# Patient Record
Sex: Female | Born: 1955 | Race: White | Hispanic: No | Marital: Married | Smoking: Never smoker
Health system: Southern US, Community
[De-identification: ages and names within clinical notes are randomized; demographics above are authoritative.]

## PROBLEM LIST (undated history)

## (undated) DIAGNOSIS — K219 Gastro-esophageal reflux disease without esophagitis: Secondary | ICD-10-CM

## (undated) DIAGNOSIS — Z923 Personal history of irradiation: Secondary | ICD-10-CM

## (undated) DIAGNOSIS — C801 Malignant (primary) neoplasm, unspecified: Secondary | ICD-10-CM

## (undated) DIAGNOSIS — K449 Diaphragmatic hernia without obstruction or gangrene: Secondary | ICD-10-CM

## (undated) DIAGNOSIS — F329 Major depressive disorder, single episode, unspecified: Secondary | ICD-10-CM

## (undated) DIAGNOSIS — F32A Depression, unspecified: Secondary | ICD-10-CM

## (undated) DIAGNOSIS — F419 Anxiety disorder, unspecified: Secondary | ICD-10-CM

## (undated) DIAGNOSIS — G4730 Sleep apnea, unspecified: Secondary | ICD-10-CM

## (undated) HISTORY — DX: Diaphragmatic hernia without obstruction or gangrene: K44.9

## (undated) HISTORY — DX: Depression, unspecified: F32.A

## (undated) HISTORY — DX: Sleep apnea, unspecified: G47.30

## (undated) HISTORY — DX: Major depressive disorder, single episode, unspecified: F32.9

## (undated) HISTORY — PX: ABDOMINAL HYSTERECTOMY: SHX81

## (undated) HISTORY — DX: Malignant (primary) neoplasm, unspecified: C80.1

## (undated) HISTORY — DX: Gastro-esophageal reflux disease without esophagitis: K21.9

## (undated) HISTORY — PX: COLONOSCOPY: SHX174

## (undated) HISTORY — DX: Anxiety disorder, unspecified: F41.9

---

## 1999-06-24 ENCOUNTER — Other Ambulatory Visit: Admission: RE | Admit: 1999-06-24 | Discharge: 1999-06-24 | Payer: Self-pay | Admitting: Obstetrics and Gynecology

## 2000-06-18 ENCOUNTER — Other Ambulatory Visit: Admission: RE | Admit: 2000-06-18 | Discharge: 2000-06-18 | Payer: Self-pay | Admitting: *Deleted

## 2000-06-18 ENCOUNTER — Other Ambulatory Visit: Admission: RE | Admit: 2000-06-18 | Discharge: 2000-06-18 | Payer: Self-pay | Admitting: Obstetrics and Gynecology

## 2000-06-25 ENCOUNTER — Encounter: Admission: RE | Admit: 2000-06-25 | Discharge: 2000-06-25 | Payer: Self-pay | Admitting: *Deleted

## 2000-06-25 ENCOUNTER — Other Ambulatory Visit: Admission: RE | Admit: 2000-06-25 | Discharge: 2000-06-25 | Payer: Self-pay | Admitting: *Deleted

## 2001-01-17 ENCOUNTER — Other Ambulatory Visit: Admission: RE | Admit: 2001-01-17 | Discharge: 2001-01-17 | Payer: Self-pay | Admitting: *Deleted

## 2001-01-31 ENCOUNTER — Encounter: Admission: RE | Admit: 2001-01-31 | Discharge: 2001-01-31 | Payer: Self-pay | Admitting: *Deleted

## 2001-04-07 ENCOUNTER — Other Ambulatory Visit: Admission: RE | Admit: 2001-04-07 | Discharge: 2001-04-07 | Payer: Self-pay | Admitting: *Deleted

## 2001-11-18 ENCOUNTER — Encounter: Payer: Self-pay | Admitting: *Deleted

## 2001-11-18 ENCOUNTER — Encounter: Admission: RE | Admit: 2001-11-18 | Discharge: 2001-11-18 | Payer: Self-pay | Admitting: *Deleted

## 2001-11-21 ENCOUNTER — Other Ambulatory Visit: Admission: RE | Admit: 2001-11-21 | Discharge: 2001-11-21 | Payer: Self-pay | Admitting: *Deleted

## 2002-02-17 ENCOUNTER — Other Ambulatory Visit: Admission: RE | Admit: 2002-02-17 | Discharge: 2002-02-17 | Payer: Self-pay | Admitting: *Deleted

## 2002-04-30 ENCOUNTER — Emergency Department (HOSPITAL_COMMUNITY): Admission: EM | Admit: 2002-04-30 | Discharge: 2002-04-30 | Payer: Self-pay | Admitting: Emergency Medicine

## 2002-05-24 ENCOUNTER — Encounter: Admission: RE | Admit: 2002-05-24 | Discharge: 2002-05-24 | Payer: Self-pay | Admitting: *Deleted

## 2003-07-19 ENCOUNTER — Ambulatory Visit (HOSPITAL_BASED_OUTPATIENT_CLINIC_OR_DEPARTMENT_OTHER): Admission: RE | Admit: 2003-07-19 | Discharge: 2003-07-19 | Payer: Self-pay | Admitting: Otolaryngology

## 2003-08-16 ENCOUNTER — Other Ambulatory Visit: Admission: RE | Admit: 2003-08-16 | Discharge: 2003-08-16 | Payer: Self-pay | Admitting: *Deleted

## 2003-12-03 ENCOUNTER — Encounter: Admission: RE | Admit: 2003-12-03 | Discharge: 2003-12-03 | Payer: Self-pay | Admitting: *Deleted

## 2005-10-21 ENCOUNTER — Encounter: Admission: RE | Admit: 2005-10-21 | Discharge: 2005-10-21 | Payer: Self-pay | Admitting: *Deleted

## 2006-03-18 ENCOUNTER — Encounter: Admission: RE | Admit: 2006-03-18 | Discharge: 2006-03-18 | Payer: Self-pay | Admitting: Gastroenterology

## 2006-04-22 ENCOUNTER — Ambulatory Visit (HOSPITAL_COMMUNITY): Admission: RE | Admit: 2006-04-22 | Discharge: 2006-04-22 | Payer: Self-pay | Admitting: Gastroenterology

## 2006-09-15 ENCOUNTER — Ambulatory Visit (HOSPITAL_COMMUNITY): Admission: RE | Admit: 2006-09-15 | Discharge: 2006-09-15 | Payer: Self-pay | Admitting: Obstetrics and Gynecology

## 2006-11-26 ENCOUNTER — Encounter: Admission: RE | Admit: 2006-11-26 | Discharge: 2006-11-26 | Payer: Self-pay | Admitting: Gastroenterology

## 2012-09-30 ENCOUNTER — Other Ambulatory Visit: Payer: Self-pay | Admitting: Internal Medicine

## 2012-09-30 DIAGNOSIS — R1310 Dysphagia, unspecified: Secondary | ICD-10-CM

## 2012-10-14 ENCOUNTER — Inpatient Hospital Stay: Admission: RE | Admit: 2012-10-14 | Payer: Self-pay | Source: Ambulatory Visit

## 2012-10-28 ENCOUNTER — Ambulatory Visit
Admission: RE | Admit: 2012-10-28 | Discharge: 2012-10-28 | Disposition: A | Discharge status: Home / Self Care | Payer: BC Managed Care – PPO | Source: Ambulatory Visit | Attending: Internal Medicine | Admitting: Internal Medicine

## 2012-10-28 DIAGNOSIS — R1310 Dysphagia, unspecified: Secondary | ICD-10-CM

## 2013-08-19 ENCOUNTER — Ambulatory Visit (INDEPENDENT_AMBULATORY_CARE_PROVIDER_SITE_OTHER): Payer: BC Managed Care – PPO | Admitting: Emergency Medicine

## 2013-08-19 VITALS — BP 120/82 | HR 90 | Temp 98.2°F | Resp 18 | Ht 66.0 in | Wt 183.4 lb

## 2013-08-19 DIAGNOSIS — J209 Acute bronchitis, unspecified: Secondary | ICD-10-CM

## 2013-08-19 MED ORDER — AZITHROMYCIN 250 MG PO TABS: ORAL_TABLET | ORAL | Status: DC

## 2013-08-19 MED ORDER — HYDROCOD POLST-CHLORPHEN POLST 10-8 MG/5ML PO LQCR: 5.0000 mL | Freq: Two times a day (BID) | ORAL | Status: DC | PRN

## 2013-08-19 NOTE — Progress Notes (Signed)
[facility] [address], [place] [address] [phone]  Date:  08/19/2013   Name:  Lisa Price   DOB:  1956-01-19   MRN:  [id number]  PCP:  Elby Showers, MD    Chief Complaint: Pneumonia, Cough and Sore Throat   History of Present Illness:  Lisa Price is a 57 y.o. very pleasant female patient who presents with the following:  Ill with nasal congestion and cough. Says that she has pain in her back from coughing.  Says cough productive but she does not spit out the sputum.  Has myalgias and arthralgias and headache.  Feels hot.  No documented fever or chills. No wheezing or shortness of breath.  History of prior pneumonia.  Husband brought illness home to her.  No improvement with over the counter medications or other home remedies. Denies other complaint or health concern today.   There are no active problems to display for this patient.   Past Medical History  Diagnosis Date  . Anxiety   . Depression     Past Surgical History  Procedure Laterality Date  . Abdominal hysterectomy      History  Substance Use Topics  . Smoking status: Never Smoker   . Smokeless tobacco: Not on file  . Alcohol Use: No    History reviewed. No pertinent family history.  No Known Allergies  Medication list has been reviewed and updated.  No current outpatient prescriptions on file prior to visit.   No current facility-administered medications on file prior to visit.    Review of Systems:  As per HPI, otherwise negative.    Physical Examination: Filed Vitals:   08/19/13 1113  BP: 120/82  Pulse: 90  Temp: 98.2 F (36.8 C)  Resp: 18   Filed Vitals:   08/19/13 1113  Height: 5\' 6"  (1.676 m)  Weight: 183 lb 6.4 oz (83.19 kg)   Body mass index is 29.62 kg/(m^2). Ideal Body Weight: Weight in (lb) to have BMI = 25: 154.6  GEN: WDWN, NAD, Non-toxic, A & O x 3 HEENT: Atraumatic, Normocephalic. Neck supple. No masses, No LAD. Ears and Nose: No  external deformity. CV: RRR, No M/G/R. No JVD. No thrill. No extra heart sounds. PULM: CTA B, no wheezes, crackles, rhonchi. No retractions. No resp. distress. No accessory muscle use. ABD: S, NT, ND, +BS. No rebound. No HSM. EXTR: No c/c/e NEURO Normal gait.  PSYCH: Normally interactive. Conversant. Not depressed or anxious appearing.  Calm demeanor.    Assessment and Plan: Bronchitis zpak tussionex   Signed,  Phillips Odor, MD

## 2013-08-19 NOTE — Patient Instructions (Addendum)

## 2014-05-04 ENCOUNTER — Other Ambulatory Visit: Payer: Self-pay

## 2014-05-04 DIAGNOSIS — Z1231 Encounter for screening mammogram for malignant neoplasm of breast: Secondary | ICD-10-CM

## 2014-05-18 ENCOUNTER — Ambulatory Visit: Payer: BC Managed Care – PPO

## 2014-05-25 ENCOUNTER — Ambulatory Visit
Admission: RE | Admit: 2014-05-25 | Discharge: 2014-05-25 | Disposition: A | Discharge status: Home / Self Care | Payer: BC Managed Care – PPO | Source: Ambulatory Visit

## 2014-05-25 DIAGNOSIS — Z1231 Encounter for screening mammogram for malignant neoplasm of breast: Secondary | ICD-10-CM

## 2015-09-26 ENCOUNTER — Ambulatory Visit (HOSPITAL_BASED_OUTPATIENT_CLINIC_OR_DEPARTMENT_OTHER): Payer: BC Managed Care – PPO | Attending: Internal Medicine | Admitting: *Deleted

## 2015-09-26 VITALS — Ht 66.0 in | Wt 198.0 lb

## 2015-09-26 DIAGNOSIS — R0683 Snoring: Secondary | ICD-10-CM | POA: Insufficient documentation

## 2015-09-26 DIAGNOSIS — G4733 Obstructive sleep apnea (adult) (pediatric): Secondary | ICD-10-CM | POA: Diagnosis not present

## 2015-09-29 DIAGNOSIS — G4733 Obstructive sleep apnea (adult) (pediatric): Secondary | ICD-10-CM | POA: Diagnosis not present

## 2015-09-29 NOTE — Progress Notes (Signed)
Patient Name: Lisa Price, Lisa Price Date: 09/26/2015 Gender: Female D.O.B: 1956/09/29 Age (years): 59 Referring Provider: Georgie Chard Day Height (inches): 66 Interpreting Physician: Baird Lyons MD, ABSM Weight (lbs): 198 RPSGT: Ne[place]iemer, Holly BMI: 32 MRN: [id number] Neck Size: 15.00 CLINICAL INFORMATION Sleep Study Type: Split Night CPAP Indication for sleep study: Snoring Epworth Sleepiness Score: 12  SLEEP STUDY TECHNIQUE As per the AASM Manual for the Scoring of Sleep and Associated Events v2.3 (April 2016) with a hypopnea requiring 4% desaturations. The channels recorded and monitored were frontal, central and occipital EEG, electrooculogram (EOG), submentalis EMG (chin), nasal and oral airflow, thoracic and abdominal wall motion, anterior tibialis EMG, snore microphone, electrocardiogram, and pulse oximetry. Continuous positive airway pressure (CPAP) was initiated when the patient met split night criteria and was titrated according to treat sleep-disordered breathing.  MEDICATIONS Medications taken by the patient :charted for review Medications administered by patient during sleep study : Trazodone  RESPIRATORY PARAMETERS Diagnostic Total AHI (/hr): 19.2 RDI (/hr): 22.7 OA Index (/hr): 13.6 CA Index (/hr): 0.0 REM AHI (/hr): N/A NREM AHI (/hr): 19.2 Supine AHI (/hr): 31.6 Non-supine AHI (/hr): 0.00 Min O2 Sat (%): 85.00 Mean O2 (%): 92.92 Time below 88% (min): 1.4     Titration Optimal Pressure (cm): 14 AHI at Optimal Pressure (/hr): 0 Min O2 at Optimal Pressure (%): 94 Supine % at Optimal (%): N/A Sleep % at Optimal (%): N/A      SLEEP ARCHITECTURE The recording time for the entire night was 415.8 minutes. During a baseline period of 240.3 minutes, the patient slept for 137.2 minutes in REM and nonREM, yielding a sleep efficiency of 57.1%. Sleep onset after lights out was 79.6 minutes with a REM latency of N/A minutes. The patient spent 5.83% of the night in stage N1 sleep,  94.17% in stage N2 sleep, 0.00% in stage N3 and 0.00% in REM. During the titration period of 173.5 minutes, the patient slept for 143.5 minutes in REM and nonREM, yielding a sleep efficiency of 82.7%. Sleep onset after CPAP initiation was 14.4 minutes with a REM latency of N/A minutes. The patient spent 8.36% of the night in stage N1 sleep, 91.64% in stage N2 sleep, 0.00% in stage N3 and 0.00% in REM.  CARDIAC DATA The 2 lead EKG demonstrated sinus rhythm. The mean heart rate was 65.71 beats per minute. Other EKG findings include: None.  LEG MOVEMENT DATA The total Periodic Limb Movements of Sleep (PLMS) were 217. The PLMS index was 46.25 . Movement with arousals 1,3/ hr  IMPRESSIONS  -Moderate obstructive sleep apnea occurred during the diagnostic portion of the study(AHI = 19.2/hour).    - Optimal  CPAP pressure 14 - No significant central sleep apnea occurred during the diagnostic portion of the study (CAI = 0.0/hour). - The patient had minimal or no oxygen desaturation during the diagnostic portion of the study (Min O2 = 85.00%) - The patient snored with Loud snoring volume during the diagnostic portion of the study. - No cardiac abnormalities were noted during this study. - Clinically significant periodic limb movements did not occur during sleep.  DIAGNOSIS - Obstructive Sleep Apnea (327.23 [G47.33 ICD-10])  RECOMMENDATIONS - Recommend CPAP trial at 14 cwp. The patient used a medium Resmed AirFit F10 full face mask with heated humidifier. - Avoid alcohol, sedatives and other CNS depressants - CPAP titration.nts that may worsen sleep apnea and disrupt normal sleep architecture. - Sleep hygiene should be reviewed to assess factors that may improve sleep quality. - Weight management  and regular exercise should be initiated or continued.  Deneise Lever Diplomate, American Board of Sleep Medicine  ELECTRONICALLY SIGNED ON:  09/29/2015, 2:11 PM [place] PH: [phone]   FX: [phone] [place]

## 2017-05-21 ENCOUNTER — Other Ambulatory Visit: Payer: Self-pay | Admitting: Internal Medicine

## 2017-05-21 DIAGNOSIS — Z1231 Encounter for screening mammogram for malignant neoplasm of breast: Secondary | ICD-10-CM

## 2017-05-31 ENCOUNTER — Ambulatory Visit
Admission: RE | Admit: 2017-05-31 | Discharge: 2017-05-31 | Disposition: A | Discharge status: Home / Self Care | Payer: BC Managed Care – PPO | Source: Ambulatory Visit | Attending: Internal Medicine | Admitting: Internal Medicine

## 2017-05-31 DIAGNOSIS — Z1231 Encounter for screening mammogram for malignant neoplasm of breast: Secondary | ICD-10-CM

## 2017-06-01 ENCOUNTER — Other Ambulatory Visit: Payer: Self-pay | Admitting: Internal Medicine

## 2017-06-01 DIAGNOSIS — R928 Other abnormal and inconclusive findings on diagnostic imaging of breast: Secondary | ICD-10-CM

## 2017-06-03 ENCOUNTER — Ambulatory Visit
Admission: RE | Admit: 2017-06-03 | Discharge: 2017-06-03 | Disposition: A | Discharge status: Home / Self Care | Payer: BC Managed Care – PPO | Source: Ambulatory Visit | Attending: Internal Medicine | Admitting: Internal Medicine

## 2017-06-03 DIAGNOSIS — R928 Other abnormal and inconclusive findings on diagnostic imaging of breast: Secondary | ICD-10-CM

## 2019-08-25 ENCOUNTER — Other Ambulatory Visit: Payer: Self-pay | Admitting: Family Medicine

## 2019-08-25 DIAGNOSIS — Z1231 Encounter for screening mammogram for malignant neoplasm of breast: Secondary | ICD-10-CM

## 2019-10-23 ENCOUNTER — Other Ambulatory Visit: Payer: Self-pay

## 2019-10-23 ENCOUNTER — Ambulatory Visit
Admission: RE | Admit: 2019-10-23 | Discharge: 2019-10-23 | Disposition: A | Discharge status: Home / Self Care | Payer: BC Managed Care – PPO | Source: Ambulatory Visit | Attending: Family Medicine | Admitting: Family Medicine

## 2019-10-23 DIAGNOSIS — Z1231 Encounter for screening mammogram for malignant neoplasm of breast: Secondary | ICD-10-CM

## 2019-12-06 ENCOUNTER — Ambulatory Visit: Payer: BC Managed Care – PPO

## 2020-10-11 ENCOUNTER — Other Ambulatory Visit: Payer: Self-pay | Admitting: Obstetrics & Gynecology

## 2020-10-11 DIAGNOSIS — Z1231 Encounter for screening mammogram for malignant neoplasm of breast: Secondary | ICD-10-CM

## 2020-11-20 ENCOUNTER — Other Ambulatory Visit: Payer: Self-pay

## 2020-11-20 ENCOUNTER — Ambulatory Visit
Admission: RE | Admit: 2020-11-20 | Discharge: 2020-11-20 | Disposition: A | Discharge status: Home / Self Care | Payer: BC Managed Care – PPO | Source: Ambulatory Visit | Attending: Obstetrics & Gynecology | Admitting: Obstetrics & Gynecology

## 2020-11-20 ENCOUNTER — Ambulatory Visit: Payer: BC Managed Care – PPO

## 2020-11-20 DIAGNOSIS — Z1231 Encounter for screening mammogram for malignant neoplasm of breast: Secondary | ICD-10-CM

## 2020-11-26 ENCOUNTER — Other Ambulatory Visit: Payer: Self-pay | Admitting: Obstetrics & Gynecology

## 2020-11-26 DIAGNOSIS — R928 Other abnormal and inconclusive findings on diagnostic imaging of breast: Secondary | ICD-10-CM

## 2020-12-11 ENCOUNTER — Ambulatory Visit
Admission: RE | Admit: 2020-12-11 | Discharge: 2020-12-11 | Disposition: A | Discharge status: Home / Self Care | Payer: BC Managed Care – PPO | Source: Ambulatory Visit | Attending: Obstetrics & Gynecology | Admitting: Obstetrics & Gynecology

## 2020-12-11 ENCOUNTER — Other Ambulatory Visit: Payer: Self-pay | Admitting: Obstetrics & Gynecology

## 2020-12-11 ENCOUNTER — Other Ambulatory Visit: Payer: Self-pay

## 2020-12-11 DIAGNOSIS — R928 Other abnormal and inconclusive findings on diagnostic imaging of breast: Secondary | ICD-10-CM

## 2020-12-19 ENCOUNTER — Other Ambulatory Visit: Payer: Self-pay

## 2020-12-19 ENCOUNTER — Ambulatory Visit
Admission: RE | Admit: 2020-12-19 | Discharge: 2020-12-19 | Disposition: A | Discharge status: Home / Self Care | Payer: BC Managed Care – PPO | Source: Ambulatory Visit | Attending: Obstetrics & Gynecology | Admitting: Obstetrics & Gynecology

## 2020-12-19 DIAGNOSIS — R928 Other abnormal and inconclusive findings on diagnostic imaging of breast: Secondary | ICD-10-CM

## 2020-12-19 HISTORY — PX: BREAST BIOPSY: SHX20

## 2020-12-24 ENCOUNTER — Encounter: Payer: Self-pay | Admitting: *Deleted

## 2020-12-24 ENCOUNTER — Other Ambulatory Visit: Payer: Self-pay | Admitting: *Deleted

## 2020-12-24 DIAGNOSIS — C50412 Malignant neoplasm of upper-outer quadrant of left female breast: Secondary | ICD-10-CM

## 2020-12-24 DIAGNOSIS — Z170 Estrogen receptor positive status [ER+]: Secondary | ICD-10-CM | POA: Insufficient documentation

## 2020-12-24 NOTE — Progress Notes (Signed)
[facility] NOTE  Patient Care Team: Lauro Franklin, MD as PCP - General (Internal Medicine) Coralie Keens, MD as Consulting Physician (General Surgery) Nicholas Lose, MD as Consulting Physician (Hematology and Oncology) Eppie Gibson, MD as Attending Physician (Radiation Oncology) Mauro Kaufmann, RN as Oncology Nurse Navigator Rockwell Germany, RN as Oncology Nurse Navigator  CHIEF COMPLAINTS/PURPOSE OF CONSULTATION:  Newly diagnosed breast cancer  HISTORY OF PRESENTING ILLNESS:  Lisa Price 65 y.o. female is here because of recent diagnosis of invasive ductal carcinoma of the left breast. Screening mammogram on 11/25/20 showed a left breast distortion. Diagnostic mammogram on 12/11/20 showed a 0.8cm mass at the 2 o'clock position in the left breast and no left axillary adenopathy. Biopsy on 12/29/20 showed invasive and in situ carcinoma, grade 2, HER-2 equivocal by IHC (2+), ER/PR+ >95%, Ki67 3%. She presents to the clinic today for initial evaluation and discussion of treatment options.   I reviewed her records extensively and collaborated the history with the patient.  SUMMARY OF ONCOLOGIC HISTORY: Oncology History  Malignant neoplasm of upper-outer quadrant of left breast in female, estrogen receptor positive ([place])  12/24/2020 Initial Diagnosis   Screening mammogram showed a 0.8cm mass at the 2 o'clock position in the left breast and no left axillary adenopathy. Biopsy showed invasive and in situ carcinoma, grade 2, HER-2 equivocal by IHC (2+), ER/PR+ >95%, Ki67 3%.   12/25/2020 Cancer Staging   Staging form: Breast, AJCC 8th Edition - Clinical stage from 12/25/2020: Stage IA (cT1b, cN0, cM0, G2, ER+, PR+, HER2: Equivocal) - Signed by Nicholas Lose, MD on 12/25/2020     MEDICAL HISTORY:  Past Medical History:  Diagnosis Date  . Anxiety   . Depression   . Hiatal hernia   . Sleep apnea     SURGICAL HISTORY: Past Surgical History:  Procedure  Laterality Date  . ABDOMINAL HYSTERECTOMY      SOCIAL HISTORY: Social History   Socioeconomic History  . Marital status: Married    Spouse name: Not on file  . Number of children: Not on file  . Years of education: Not on file  . Highest education level: Not on file  Occupational History  . Not on file  Tobacco Use  . Smoking status: Never Smoker  . Smokeless tobacco: Not on file  Substance and Sexual Activity  . Alcohol use: No  . Drug use: No  . Sexual activity: Not on file  Other Topics Concern  . Not on file  Social History Narrative  . Not on file   Social Determinants of Health   Financial Resource Strain: Not on file  Food Insecurity: No Food Insecurity  . Worried About Charity fundraiser in the Last Year: Never true  . Ran Out of Food in the Last Year: Never true  Transportation Needs: No Transportation Needs  . Lack of Transportation (Medical): No  . Lack of Transportation (Non-Medical): No  Physical Activity: Not on file  Stress: Not on file  Social Connections: Not on file  Intimate Partner Violence: Not on file    FAMILY HISTORY: Family History  Problem Relation Age of Onset  . Breast cancer Paternal Grandmother   . Thyroid cancer Maternal Grandmother     ALLERGIES:  has No Known Allergies.  MEDICATIONS:  Current Outpatient Medications  Medication Sig Dispense Refill  . clonazePAM (KLONOPIN) 1 MG tablet 1 tablet    . desvenlafaxine (PRISTIQ) 100 MG 24 hr tablet Take 100 mg  by mouth at bedtime.    . fish oil-omega-3 fatty acids 1000 MG capsule Take 2 g by mouth daily.    Marland Kitchen L-Methylfolate-Algae (DEPLIN 15) 15-90.314 MG CAPS Take 1 tablet by mouth.    . lisdexamfetamine (VYVANSE) 70 MG capsule 1 capsule every morning    . traZODone (DESYREL) 100 MG tablet trazodone 100 mg tablet     No current facility-administered medications for this visit.    REVIEW OF SYSTEMS:   Constitutional: Denies fevers, chills or abnormal night sweats   All other  systems were reviewed with the patient and are negative.  PHYSICAL EXAMINATION: ECOG PERFORMANCE STATUS: 0 - Asymptomatic  Vitals:   12/25/20 0919  BP: 110/68  Pulse: 85  Resp: 16  Temp: (!) 97.5 F (36.4 C)  SpO2: 98%   Filed Weights   12/25/20 0919  Weight: 205 lb 11.2 oz (93.3 kg)    LABORATORY DATA:  I have reviewed the data as listed Lab Results  Component Value Date   WBC 9.1 12/25/2020   HGB 13.7 12/25/2020   HCT 41.7 12/25/2020   MCV 83.9 12/25/2020   PLT 352 12/25/2020   Lab Results  Component Value Date   NA 140 12/25/2020   K 4.0 12/25/2020   CL 107 12/25/2020   CO2 24 12/25/2020    RADIOGRAPHIC STUDIES: I have personally reviewed the radiological reports and agreed with the findings in the report.  ASSESSMENT AND PLAN:  Malignant neoplasm of upper-outer quadrant of left breast in female, estrogen receptor positive ([place]) 12/24/2020: Screening mammogram showed a 0.8cm mass at the 2 o'clock position in the left breast and no left axillary adenopathy. Biopsy showed invasive and in situ carcinoma, grade 2, HER-2 equivocal by IHC (2+), FISH pending ER/PR+ >95%, Ki67 3%. T1b N0 stage Ia clinical stage  Pathology and radiology counseling:Discussed with the patient, the details of pathology including the type of breast cancer,the clinical staging, the significance of ER, PR and HER-2/neu receptors and the implications for treatment. After reviewing the pathology in detail, we proceeded to discuss the different treatment options between surgery, radiation, chemotherapy, antiestrogen therapies.  Recommendations: 1. Breast conserving surgery with sentinel lymph node biopsy followed by 2. Oncotype DX testing to determine if chemotherapy would be of any benefit followed by 3. Adjuvant radiation therapy followed by 4. Adjuvant antiestrogen therapy with anastrozole 1 mg daily x5 to 7 years  Oncotype counseling: I discussed Oncotype DX test. I explained to the patient  that this is a 21 gene panel to evaluate patient tumors DNA to calculate recurrence score. This would help determine whether patient has high risk or low risk breast cancer. She understands that if her tumor was found to be high risk, she would benefit from systemic chemotherapy. If low risk, no need of chemotherapy.  Return to clinic after surgery to discuss final pathology report and then determine if Oncotype DX testing will need to be sent.    All questions were answered. The patient knows to call the clinic with any problems, questions or concerns.   Rulon Eisenmenger, MD, MPH 12/25/2020    I, Molly Dorshimer, am acting as scribe for Nicholas Lose, MD.  I have reviewed the above documentation for accuracy and completeness, and I agree with the above.

## 2020-12-24 NOTE — Progress Notes (Signed)
Confirmed [facility] for 12/25/20 at 0815 .  Instructions and contact information given.

## 2020-12-25 ENCOUNTER — Encounter: Payer: Self-pay | Admitting: Licensed Clinical Social Worker

## 2020-12-25 ENCOUNTER — Ambulatory Visit: Payer: BC Managed Care – PPO | Attending: Surgery | Admitting: Physical Therapy

## 2020-12-25 ENCOUNTER — Encounter: Payer: Self-pay | Admitting: Physical Therapy

## 2020-12-25 ENCOUNTER — Inpatient Hospital Stay: Payer: BC Managed Care – PPO | Attending: Hematology and Oncology | Admitting: Hematology and Oncology

## 2020-12-25 ENCOUNTER — Encounter: Payer: Self-pay | Admitting: *Deleted

## 2020-12-25 ENCOUNTER — Encounter: Payer: Self-pay | Admitting: Genetic Counselor

## 2020-12-25 ENCOUNTER — Ambulatory Visit (HOSPITAL_BASED_OUTPATIENT_CLINIC_OR_DEPARTMENT_OTHER): Payer: BC Managed Care – PPO | Admitting: Genetic Counselor

## 2020-12-25 ENCOUNTER — Other Ambulatory Visit: Payer: Self-pay

## 2020-12-25 ENCOUNTER — Inpatient Hospital Stay: Payer: BC Managed Care – PPO

## 2020-12-25 ENCOUNTER — Other Ambulatory Visit: Payer: Self-pay | Admitting: Surgery

## 2020-12-25 ENCOUNTER — Ambulatory Visit
Admission: RE | Admit: 2020-12-25 | Discharge: 2020-12-25 | Disposition: A | Discharge status: Home / Self Care | Payer: BC Managed Care – PPO | Source: Ambulatory Visit | Attending: Radiation Oncology | Admitting: Radiation Oncology

## 2020-12-25 DIAGNOSIS — Z170 Estrogen receptor positive status [ER+]: Secondary | ICD-10-CM

## 2020-12-25 DIAGNOSIS — R293 Abnormal posture: Secondary | ICD-10-CM | POA: Insufficient documentation

## 2020-12-25 DIAGNOSIS — C50412 Malignant neoplasm of upper-outer quadrant of left female breast: Secondary | ICD-10-CM | POA: Insufficient documentation

## 2020-12-25 DIAGNOSIS — Z803 Family history of malignant neoplasm of breast: Secondary | ICD-10-CM

## 2020-12-25 DIAGNOSIS — Z853 Personal history of malignant neoplasm of breast: Secondary | ICD-10-CM

## 2020-12-25 DIAGNOSIS — Z808 Family history of malignant neoplasm of other organs or systems: Secondary | ICD-10-CM | POA: Diagnosis not present

## 2020-12-25 HISTORY — DX: Family history of malignant neoplasm of breast: Z80.3

## 2020-12-25 HISTORY — DX: Family history of malignant neoplasm of other organs or systems: Z80.8

## 2020-12-25 LAB — CMP (CANCER CENTER ONLY)
ALT: 12 U/L (ref 0–44)
AST: 12 U/L — ABNORMAL LOW (ref 15–41)
Albumin: 3.7 g/dL (ref 3.5–5.0)
Alkaline Phosphatase: 90 U/L (ref 38–126)
Anion gap: 9 (ref 5–15)
BUN: 21 mg/dL (ref 8–23)
CO2: 24 mmol/L (ref 22–32)
Calcium: 9.4 mg/dL (ref 8.9–10.3)
Chloride: 107 mmol/L (ref 98–111)
Creatinine: 0.78 mg/dL (ref 0.44–1.00)
GFR, Estimated: >60 mL/min (ref >60)
Glucose, Bld: 90 mg/dL (ref 70–99)
Potassium: 4 mmol/L (ref 3.5–5.1)
Sodium: 140 mmol/L (ref 135–145)
Total Bilirubin: 0.3 mg/dL (ref 0.3–1.2)
Total Protein: 7 g/dL (ref 6.5–8.1)

## 2020-12-25 LAB — CBC WITH DIFFERENTIAL (CANCER CENTER ONLY)
Abs Immature Granulocytes: 0.02 K/uL (ref 0.00–0.07)
Basophils Absolute: 0.1 K/uL (ref 0.0–0.1)
Basophils Relative: 1 %
Eosinophils Absolute: 0.5 K/uL (ref 0.0–0.5)
Eosinophils Relative: 5 %
HCT: 41.7 % (ref 36.0–46.0)
Hemoglobin: 13.7 g/dL (ref 12.0–15.0)
Immature Granulocytes: 0 %
Lymphocytes Relative: 19 %
Lymphs Abs: 1.7 K/uL (ref 0.7–4.0)
MCH: 27.6 pg (ref 26.0–34.0)
MCHC: 32.9 g/dL (ref 30.0–36.0)
MCV: 83.9 fL (ref 80.0–100.0)
Monocytes Absolute: 0.7 K/uL (ref 0.1–1.0)
Monocytes Relative: 8 %
Neutro Abs: 6.1 K/uL (ref 1.7–7.7)
Neutrophils Relative %: 67 %
Platelet Count: 352 K/uL (ref 150–400)
RBC: 4.97 MIL/uL (ref 3.87–5.11)
RDW: 13 % (ref 11.5–15.5)
WBC Count: 9.1 K/uL (ref 4.0–10.5)
nRBC: 0 % (ref 0.0–0.2)

## 2020-12-25 LAB — GENETIC SCREENING ORDER

## 2020-12-25 NOTE — Assessment & Plan Note (Signed)
12/24/2020: Screening mammogram showed a 0.8cm mass at the 2 o'clock position in the left breast and no left axillary adenopathy. Biopsy showed invasive and in situ carcinoma, grade 2, HER-2 equivocal by IHC (2+), FISH pending ER/PR+ >95%, Ki67 3%. T1b N0 stage Ia clinical stage  Pathology and radiology counseling:Discussed with the patient, the details of pathology including the type of breast cancer,the clinical staging, the significance of ER, PR and HER-2/neu receptors and the implications for treatment. After reviewing the pathology in detail, we proceeded to discuss the different treatment options between surgery, radiation, chemotherapy, antiestrogen therapies.  Recommendations: 1. Breast conserving surgery with sentinel lymph node biopsy followed by 2. Oncotype DX testing to determine if chemotherapy would be of any benefit followed by 3. Adjuvant radiation therapy followed by 4. Adjuvant antiestrogen therapy with anastrozole 1 mg daily x5 to 7 years  Oncotype counseling: I discussed Oncotype DX test. I explained to the patient that this is a 21 gene panel to evaluate patient tumors DNA to calculate recurrence score. This would help determine whether patient has high risk or low risk breast cancer. She understands that if her tumor was found to be high risk, she would benefit from systemic chemotherapy. If low risk, no need of chemotherapy.  Return to clinic after surgery to discuss final pathology report and then determine if Oncotype DX testing will need to be sent.

## 2020-12-25 NOTE — Progress Notes (Signed)
Radiation Oncology         ([phone]) [phone] ________________________________  Multidisciplinary Breast Oncology Clinic [facility]) Initial Outpatient Consultation  Name: Lisa Price MRN: [id number]  Date: 12/25/2020  DOB: 26-Sep-1956  KG:SUPJ, Darlen Round, MD  Coralie Keens, MD   REFERRING PHYSICIAN: Coralie Keens, MD  DIAGNOSIS: The encounter diagnosis was Malignant neoplasm of upper-outer quadrant of left breast in female, estrogen receptor positive ([place]).  Stage IA Left Breast UOQ, Invasive Ductal Carcinoma with DCIS, ER+ / PR+ / Her2 pending, Grade 2    ICD-10-CM   1. Malignant neoplasm of upper-outer quadrant of left breast in female, estrogen receptor positive ([place])  C50.412    Z17.0     HISTORY OF PRESENT ILLNESS::Lisa Price is a 64 y.o. female who is presenting to the office today for evaluation of her newly diagnosed breast cancer. She is accompanied by her husband. She is doing well overall.   She had routine screening mammography on 11/20/2020 that showed a possible distortion in the left breast. She underwent unilateral diagnostic mammography with tomography and left breast ultrasonography at [facility] on 12/11/2020 that showed a persistent architectural distortion involving the upper outer quadrant of the left breast with a vague hypoechoic focus at the 2 o'clock position approximately 5 cm from the nipple, which was likely the sonographic correlate. There was no pathologic left axillary lymphadenopathy.  Biopsy on 12/19/2020 showed: grade 2 invasive ductal carcinoma with ductal carcinoma in situ and necrosis. Prognostic indicators were significant for: estrogen receptor, >95% positive with strong staining intensity and progesterone receptor, >95% positive both with moderate staining intensity. Proliferation marker Ki67 at 3%. HER2 pending.  Family history is significant for paternal grandmother with breast cancer apparently diagnosed at a young age.  Also significant  for father with a history of thyroid cancer and maternal grandmother with history of thyroid cancer  The patient was referred today for presentation in the multidisciplinary conference.  Radiology studies and pathology slides were presented there for review and discussion of treatment options.  A consensus was discussed regarding potential next steps.  PREVIOUS RADIATION THERAPY: No  PAST MEDICAL HISTORY:  Past Medical History:  Diagnosis Date  . Anxiety   . Depression   . Hiatal hernia   . Sleep apnea     PAST SURGICAL HISTORY: Past Surgical History:  Procedure Laterality Date  . ABDOMINAL HYSTERECTOMY      FAMILY HISTORY:  Family History  Problem Relation Age of Onset  . Breast cancer Paternal Grandmother   . Thyroid cancer Maternal Grandmother     SOCIAL HISTORY:  Social History   Socioeconomic History  . Marital status: Married    Spouse name: Not on file  . Number of children: Not on file  . Years of education: Not on file  . Highest education level: Not on file  Occupational History  . Not on file  Tobacco Use  . Smoking status: Never Smoker  . Smokeless tobacco: Not on file  Substance and Sexual Activity  . Alcohol use: No  . Drug use: No  . Sexual activity: Not on file  Other Topics Concern  . Not on file  Social History Narrative  . Not on file   Social Determinants of Health   Financial Resource Strain: Not on file  Food Insecurity: No Food Insecurity  . Worried About Charity fundraiser in the Last Year: Never true  . Ran Out of Food in the Last Year: Never true  Transportation  Needs: No Transportation Needs  . Lack of Transportation (Medical): No  . Lack of Transportation (Non-Medical): No  Physical Activity: Not on file  Stress: Not on file  Social Connections: Not on file    ALLERGIES: No Known Allergies  MEDICATIONS:  Current Outpatient Medications  Medication Sig Dispense Refill  . clonazePAM (KLONOPIN) 1 MG tablet 1 tablet    .  desvenlafaxine (PRISTIQ) 100 MG 24 hr tablet Take 100 mg by mouth at bedtime.    . fish oil-omega-3 fatty acids 1000 MG capsule Take 2 g by mouth daily.    Marland Kitchen L-Methylfolate-Algae (DEPLIN 15) 15-90.314 MG CAPS Take 1 tablet by mouth.    . lisdexamfetamine (VYVANSE) 70 MG capsule 1 capsule every morning    . traZODone (DESYREL) 100 MG tablet trazodone 100 mg tablet     No current facility-administered medications for this encounter.    REVIEW OF SYSTEMS: A 10+ POINT REVIEW OF SYSTEMS WAS OBTAINED including neurology, dermatology, psychiatry, cardiac, respiratory, lymph, extremities, GI, GU, musculoskeletal, constitutional, reproductive, HEENT. On the provided form, she reports no pain within the breast nipple discharge or bleeding. She denies swelling in her left arm and any other symptoms.    PHYSICAL EXAM:  Vitals - 1 value per visit 5/63/8756  SYSTOLIC 433  DIASTOLIC 68  Pulse 85  Temperature 97.5  Respirations 16  Weight (lb) 205.7  Height 5' 6"  BMI 33.2  VISIT REPORT     Lungs are clear to auscultation bilaterally. Heart has regular rate and rhythm. No palpable cervical, supraclavicular, or axillary adenopathy. Abdomen soft, non-tender, normal bowel sounds. Right breast with no palpable mass, nipple discharge, or bleeding. Left breast with biopsy site in the upper outer quadrant with some mild bruising present.  No dominant mass appreciated in the breast nipple discharge or bleeding.   KPS = 90  100 - Normal; no complaints; no evidence of disease. 90   - Able to carry on normal activity; minor signs or symptoms of disease. 80   - Normal activity with effort; some signs or symptoms of disease. 7   - Cares for self; unable to carry on normal activity or to do active work. 60   - Requires occasional assistance, but is able to care for most of his personal needs. 50   - Requires considerable assistance and frequent medical care. 65   - Disabled; requires special care and  assistance. 42   - Severely disabled; hospital admission is indicated although death not imminent. 39   - Very sick; hospital admission necessary; active supportive treatment necessary. 10   - Moribund; fatal processes progressing rapidly. 0     - Dead  Karnofsky DA, Abelmann [place], Craver LS and Burchenal JH [phone]) The use of the nitrogen mustards in the palliative treatment of carcinoma: with particular reference to bronchogenic carcinoma Cancer 1 634-56  LABORATORY DATA:  Lab Results  Component Value Date   WBC 9.1 12/25/2020   HGB 13.7 12/25/2020   HCT 41.7 12/25/2020   MCV 83.9 12/25/2020   PLT 352 12/25/2020   Lab Results  Component Value Date   NA 140 12/25/2020   K 4.0 12/25/2020   CL 107 12/25/2020   CO2 24 12/25/2020   Lab Results  Component Value Date   ALT 12 12/25/2020   AST 12 (L) 12/25/2020   ALKPHOS 90 12/25/2020   BILITOT 0.3 12/25/2020    PULMONARY FUNCTION TEST:   Recent Review Flowsheet Data   There is no flowsheet  data to display.     RADIOGRAPHY: US BREAST LTD UNI LEFT INC AXILLA  Result Date: 12/11/2020 CLINICAL DATA:  Recall from screening mammography with tomosynthesis, possible architectural distortion involving the UPPER OUTER QUADRANT of the LEFT breast at MIDDLE to POSTERIOR depth. EXAM: DIGITAL DIAGNOSTIC LEFT MAMMOGRAM WITH CAD AND TOMO ULTRASOUND LEFT BREAST COMPARISON:  Previous exam(s). ACR Breast Density Category c: The breast tissue is heterogeneously dense, which may obscure small masses. FINDINGS: Tomosynthesis and synthesized spot-compression CC and MLO views of the area of concern in the LEFT breast and a tomosynthesis and synthesized full field mediolateral view of the LEFT breast were obtained. The full field mediolateral image was processed with CAD. Spot compression images confirm persistent focal architectural distortion involving the UPPER OUTER QUADRANT at MIDDLE to POSTERIOR depth. There are scattered faint calcifications associated  with the distortion, though there is no associated mass. No suspicious findings elsewhere on the full field mediolateral images. Targeted ultrasound is performed, showing a vague hypoechoic focus at the 2 o'clock position approximately 5 cm from the nipple measuring approximately 8 x 6 x 7 mm, demonstrating posterior acoustic shadowing on the images obtained with tissue harmonics, likely the sonographic correlate for the mammographic asymmetry. However, the focus is difficult to reproduce during real-time scanning. Sonographic evaluation of the LEFT axilla demonstrates no pathologic lymphadenopathy. IMPRESSION: 1. Persistent architectural distortion involving the UPPER OUTER QUADRANT of the LEFT breast with a vague hypoechoic focus at the 2 o'clock position approximately 5 cm from the nipple which is the likely sonographic correlate. This vague hypoechoic focus is difficult to reproduce at real-time ultrasound examination. Therefore, stereotactic tomosynthesis guidance is preferred for biopsy. 2. No pathologic LEFT axillary lymphadenopathy. RECOMMENDATION: Stereotactic tomosynthesis core needle biopsy of the architectural distortion involving the UPPER OUTER QUADRANT of the LEFT breast. The stereotactic tomosynthesis core needle biopsy procedure was discussed with the patient and her questions were answered. She wishes to proceed and the biopsy has been scheduled at her convenience. I have discussed the findings and recommendations with the patient. BI-RADS CATEGORY  4: Suspicious. Electronically Signed   By: Evangeline Dakin M.D.   On: 12/11/2020 16:15   MM DIAG BREAST TOMO UNI LEFT  Result Date: 12/11/2020 CLINICAL DATA:  Recall from screening mammography with tomosynthesis, possible architectural distortion involving the UPPER OUTER QUADRANT of the LEFT breast at MIDDLE to POSTERIOR depth. EXAM: DIGITAL DIAGNOSTIC LEFT MAMMOGRAM WITH CAD AND TOMO ULTRASOUND LEFT BREAST COMPARISON:  Previous exam(s). ACR Breast  Density Category c: The breast tissue is heterogeneously dense, which may obscure small masses. FINDINGS: Tomosynthesis and synthesized spot-compression CC and MLO views of the area of concern in the LEFT breast and a tomosynthesis and synthesized full field mediolateral view of the LEFT breast were obtained. The full field mediolateral image was processed with CAD. Spot compression images confirm persistent focal architectural distortion involving the UPPER OUTER QUADRANT at MIDDLE to POSTERIOR depth. There are scattered faint calcifications associated with the distortion, though there is no associated mass. No suspicious findings elsewhere on the full field mediolateral images. Targeted ultrasound is performed, showing a vague hypoechoic focus at the 2 o'clock position approximately 5 cm from the nipple measuring approximately 8 x 6 x 7 mm, demonstrating posterior acoustic shadowing on the images obtained with tissue harmonics, likely the sonographic correlate for the mammographic asymmetry. However, the focus is difficult to reproduce during real-time scanning. Sonographic evaluation of the LEFT axilla demonstrates no pathologic lymphadenopathy. IMPRESSION: 1. Persistent architectural distortion involving the  UPPER OUTER QUADRANT of the LEFT breast with a vague hypoechoic focus at the 2 o'clock position approximately 5 cm from the nipple which is the likely sonographic correlate. This vague hypoechoic focus is difficult to reproduce at real-time ultrasound examination. Therefore, stereotactic tomosynthesis guidance is preferred for biopsy. 2. No pathologic LEFT axillary lymphadenopathy. RECOMMENDATION: Stereotactic tomosynthesis core needle biopsy of the architectural distortion involving the UPPER OUTER QUADRANT of the LEFT breast. The stereotactic tomosynthesis core needle biopsy procedure was discussed with the patient and her questions were answered. She wishes to proceed and the biopsy has been scheduled at  her convenience. I have discussed the findings and recommendations with the patient. BI-RADS CATEGORY  4: Suspicious. Electronically Signed   By: Evangeline Dakin M.D.   On: 12/11/2020 16:15   MM CLIP PLACEMENT LEFT  Result Date: 12/19/2020 CLINICAL DATA:  Evaluate biopsy marker EXAM: DIAGNOSTIC LEFT MAMMOGRAM POST STEREOTACTIC BIOPSY COMPARISON:  Previous exam(s). FINDINGS: Mammographic images were obtained following stereotactic guided biopsy of left breast distortion. The biopsy clip is 5 mm lateral and 9 mm inferior to the distortion. There is air in the region of the distortion suggesting it was appropriately sample. IMPRESSION: The biopsy clip is 5 mm lateral and 9 mm inferior to the biopsied distortion. Final Assessment: Post Procedure Mammograms for Marker Placement Electronically Signed   By: Dorise Bullion III M.D   On: 12/19/2020 12:35   MM LT BREAST BX W LOC DEV 1ST LESION IMAGE BX SPEC STEREO GUIDE  Addendum Date: 12/25/2020   ADDENDUM REPORT: 12/20/2020 15:21 ADDENDUM: Pathology revealed GRADE II INVASIVE DUCTAL CARCINOMA, DUCTAL CARCINOMA IN SITU WITH NECROSIS of the LEFT breast, upper outer quadrant. This was found to be concordant by Dr. Dorise Bullion. Pathology results were discussed with the patient by telephone. The patient reported doing well after the biopsy with tenderness at the site. Post biopsy instructions and care were reviewed and questions were answered. The patient was encouraged to call [facility] for any additional concerns. The patient was referred to [facility] on December 25, 2020. Pathology results reported by Stacie Acres RN on 12/20/2020. Electronically Signed   By: Dorise Bullion III M.D   On: 12/20/2020 15:21   Result Date: 12/25/2020 CLINICAL DATA:  Biopsy of left breast distortion EXAM: LEFT BREAST STEREOTACTIC CORE NEEDLE BIOPSY COMPARISON:  Previous exams.  FINDINGS: The patient and I discussed the procedure of stereotactic-guided biopsy including benefits and alternatives. We discussed the high likelihood of a successful procedure. We discussed the risks of the procedure including infection, bleeding, tissue injury, clip migration, and inadequate sampling. Informed written consent was given. The usual time out protocol was performed immediately prior to the procedure. Using sterile technique and 1% Lidocaine as local anesthetic, under stereotactic guidance, a 9 gauge vacuum assisted device was used to perform core needle biopsy of distortion in the upper outer left breast using a superior approach. Lesion quadrant: Upper-outer left At the conclusion of the procedure, a coil shaped tissue marker clip was deployed into the biopsy cavity. Follow-up 2-view mammogram was performed and dictated separately. IMPRESSION: Stereotactic-guided biopsy of distortion in the upper outer left breast. No apparent complications. Electronically Signed: By: Dorise Bullion III M.D On: 12/19/2020 12:26      IMPRESSION: Stage IA Left Breast UOQ, Invasive Ductal Carcinoma with DCIS, ER+ / PR+ / Her2 pending, Grade 2  The patient will be a good candidate for  breast conservation with radiotherapy to the left breast. We discussed the general course of radiation, potential side effects, and toxicities with radiation and the patient is interested in this approach.  She would appear to be a candidate for hypofractionated accelerated radiation therapy over approximately 4 weeks assuming her sentinel node procedure is negative   PLAN:  1. Genetics 2. Lumpectomy with sentinel node procedure 3. Oncotype depending on final tumor size 4. Adjuvant radiation therapy 5. Aromatase inhibitor   ------------------------------------------------  Blair Promise, PhD, MD  This document serves as a record of services personally performed by Gery Pray, MD. It was created on his behalf by  Clerance Lav, a trained medical scribe. The creation of this record is based on the scribe's personal observations and the provider's statements to them. This document has been checked and approved by the attending provider.

## 2020-12-25 NOTE — Patient Instructions (Signed)

## 2020-12-25 NOTE — Progress Notes (Signed)
[facility] Clinical Social Work  Initial Assessment   Lisa Price is a 65 y.o. year old female alone (husband was with her during MD visits). Clinical Social Work was referred by [facility] for assessment of psychosocial needs.   SDOH (Social Determinants of Health) assessments performed: Yes SDOH Interventions   Flowsheet Row Most Recent Value  SDOH Interventions   Food Insecurity Interventions Intervention Not Indicated  Transportation Interventions Intervention Not Indicated      Distress Screen completed: Yes ONCBCN DISTRESS SCREENING 12/25/2020  Screening Type Initial Screening  Distress experienced in past week (1-10) 8  Emotional problem type Depression;Nervousness/Anxiety;Adjusting to illness  Information Concerns Type Lack of info about diagnosis;Lack of info about treatment  Physical Problem type Sleep/insomnia;Constipation/diarrhea      Family/Social Information:  . Housing Arrangement: patient lives with husband . Family members/support persons in your life? Family- husband and daughter (lives in [place], [place] expecting first child) . Transportation concerns: no  . Employment: Retired Transport planner. Income source: husband's employment . Financial concerns: No o Type of concern: None . Food access concerns: no . Services Currently in place:  Psychiatrist- Dr. Toy Care  Coping/ Adjustment to diagnosis: . Patient understands treatment plan and what happens next? yes, feels confident and cared for by medical team . Concerns about diagnosis and/or treatment: general adjustment to cancer and worries about treatment . Patient reported stressors: Depression, Anxiety and Adjusting to my illness (long standing mental health concerns- managed currently by psychiatry) . Hopes and priorities: hopes to be treated and later be able to move closer to her daughter who is having a baby . Current coping skills/ strengths: Average or above average intelligence, Capable of independent living, Communication  skills, Motivation for treatment/growth and Supportive family/friends    SUMMARY: Current SDOH Barriers:  Marland Kitchen Mental Health Concerns   Clinical Social Work Clinical Goal(s):  Marland Kitchen Patient will continue to manage mental health with psychiatrist. Will reach out to team here for additional coping support around diagnosis  Interventions: . Discussed common feeling and emotions when being diagnosed with cancer, and the importance of support during treatment . Informed patient of the support team roles and support services at [facility] . Provided CSW contact information and encouraged patient to call with any questions or concerns   Follow Up Plan: Patient will contact CSW with any support or resource needs Patient verbalizes understanding of plan: Yes    Christeen Douglas , LCSW

## 2020-12-25 NOTE — Progress Notes (Signed)
REFERRING PROVIDER: Nicholas Lose, MD   PRIMARY PROVIDER:  Lauro Franklin, MD  PRIMARY REASON FOR VISIT:  Encounter Diagnoses  Name Primary?  . Family history of breast cancer   . Family history of thyroid cancer   . Malignant neoplasm of upper-outer quadrant of left breast in female, estrogen receptor positive ([place]) Yes   I connected with Lisa Price on 12/25/2020 at 11am EDT by Webex video conference and verified that I am speaking with the correct person using two identifiers.   Patient location: [facility] Provider location: [place] OF PRESENT ILLNESS:   Lisa Price, a 65 y.o. female, was seen for a [place] cancer genetics consultation at the request of Dr. Lindi Adie during breast multi-disciplinary clinic due to a personal and family history of cancer.  [name] presents to clinic today to discuss the possibility of a hereditary predisposition to cancer, to discuss genetic testing, and to further clarify her future cancer risks, as well as potential cancer risks for family members.   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 The preliminary treatment plan includes breast conserving surgery with sentinel lymph node biopsy, Oncotype to determine potential benefit of chemotherapy, adjuvant radiation, and anti-estrogens.   CANCER HISTORY:  Oncology History  Malignant neoplasm of upper-outer quadrant of left breast in female, estrogen receptor positive ([place])  12/24/2020 Initial Diagnosis   Screening mammogram showed a 0.8cm mass at the 2 o'clock position in the left breast and no left axillary adenopathy. Biopsy showed invasive and in situ carcinoma, grade 2, HER-2 equivocal by IHC (2+), ER/PR+ >95%, Ki67 3%.   12/25/2020 Cancer Staging   Staging form: Breast, AJCC 8th Edition - Clinical stage from 12/25/2020: Stage IA (cT1b, cN0, cM0, G2, ER+, PR+, HER2: Equivocal) - Signed by Nicholas Lose, MD on  12/25/2020     RISK FACTORS:  Menarche was at age 75 or 88.  First live birth at age 84.  OCP use for approximately 0 years.  Ovaries intact: no.  Hysterectomy: yes; 9 years ago due to endometriosis  Menopausal status: postmenopausal.  HRT use: 0 years. Colonoscopy: yes; less than one year ago. Mammogram within the last year: yes. Up to date with pelvic exams: yes.   Past Medical History:  Diagnosis Date  . Anxiety   . Depression   . Hiatal hernia   . Sleep apnea     Past Surgical History:  Procedure Laterality Date  . ABDOMINAL HYSTERECTOMY      Social History   Socioeconomic History  . Marital status: Married    Spouse name: Not on file  . Number of children: Not on file  . Years of education: Not on file  . Highest education level: Not on file  Occupational History  . Not on file  Tobacco Use  . Smoking status: Never Smoker  . Smokeless tobacco: Not on file  Substance and Sexual Activity  . Alcohol use: No  . Drug use: No  . Sexual activity: Not on file  Other Topics Concern  . Not on file  Social History Narrative  . Not on file   Social Determinants of Health   Financial Resource Strain: Not on file  Food Insecurity: Not on file  Transportation Needs: Not on file  Physical Activity: Not on file  Stress: Not on file  Social Connections: Not on file     FAMILY HISTORY:  We obtained  a detailed, 4-generation family history.  Significant diagnoses are listed below: Family History  Problem Relation Age of Onset  . Melanoma Mother   . Thyroid cancer Father        unknown age diagnosed  . Breast cancer Paternal Grandmother        dx before age 78  . Thyroid cancer Maternal Grandmother        dx after age 66    Lisa Price has one daughter, Lisa Price, who is 57. Lisa Price has three brothers and one sister.  She has limited contact with her siblings but does not report any cancer.    [name]' mother had a history of melanoma.   Her maternal  grandmother had a history of thyroid cancer after the age of 74.  No other maternal family history of cancer was reported.  Lisa Price had limited information about her maternal aunts, uncles, and cousins.   Lisa Price reported thyroid cancer in her father diagnosed at an unknown age.  Lisa Price paternal grandmother was diagnosed with breast cancer before the age of 57.  Lisa Price had limited information about other paternal family members.   Lisa Price is unaware of previous family history of genetic testing for hereditary cancer risks. Patient's maternal ancestors are of [place] descent, and paternal ancestors are of [place] descent. There is no reported Ashkenazi Jewish ancestry. There is no known consanguinity.  GENETIC COUNSELING ASSESSMENT: Lisa Price is a 65 y.o. female with a personal and family history of cancer which is somewhat suggestive of a hereditary cancer syndrome and predisposition to cancer given the presence of related cancers in the family. We, therefore, discussed and recommended the following at today's visit.   DISCUSSION: We discussed that 5 - 10% of cancer is hereditary, with most cases of hereditary breast cancer associated with mutations in BRCA1/2.  There are other genes that can be associated with hereditary breast cancer syndromes.  Type of cancer risk and level of risk are gene-specific.  We discussed that testing is beneficial for several reasons including knowing how to follow individuals after completing their treatment, identifying whether potential treatment options would be beneficial, and understanding if other family members could be at risk for cancer and allowing them to undergo genetic testing.   We reviewed the characteristics, features and inheritance patterns of hereditary cancer syndromes. We also discussed genetic testing, including the appropriate family members to test, the process of testing, insurance coverage and turn-around-time for results. We discussed  the implications of a negative, positive and/or variant of uncertain significant result. In order to get genetic test results in a timely manner so that Lisa Price can use these genetic test results for surgical decisions, we recommended Lisa Price pursue genetic testing for the STAT BRCAPlus Panel.  The BRCAplus panel offered by [employer] and includes sequencing and deletion/duplication analysis for the following 8 genes: ATM, BRCA1, BRCA2, CDH1, CHEK2, PALB2, PTEN, and TP53. Once complete, we recommend Lisa Price pursue reflex genetic testing to a panel that contains other types of cancer, including thyroid cancer.   The CancerNext-Expanded gene panel offered by [facility] and includes sequencing, rearrangement, and RNA analysis for the following 77 genes: AIP, ALK, APC, ATM, AXIN2, BAP1, BARD1, BLM, BMPR1A, BRCA1, BRCA2, BRIP1, CDC73, CDH1, CDK4, CDKN1B, CDKN2A, CHEK2, CTNNA1, DICER1, FANCC, FH, FLCN, GALNT12, KIF1B, LZTR1, MAX, MEN1, MET, MLH1, MSH2, MSH3, MSH6, MUTYH, NBN, NF1, NF2, NTHL1, PALB2, PHOX2B, PMS2, POT1, PRKAR1A, PTCH1, PTEN, RAD51C, RAD51D, RB1, RECQL, RET, SDHA,  SDHAF2, SDHB, SDHC, SDHD, SMAD4, SMARCA4, SMARCB1, SMARCE1, STK11, SUFU, TMEM127, TP53, TSC1, TSC2, VHL and XRCC2 (sequencing and deletion/duplication); EGFR, EGLN1, HOXB13, KIT, MITF, PDGFRA, POLD1, and POLE (sequencing only); EPCAM and GREM1 (deletion/duplication only).   Based on Lisa Price's personal and family history of breast cancer, she meets medical criteria for genetic testing. Despite that she meets criteria, she may still have an out of pocket cost. We discussed that if her out of pocket cost for testing is over $100, the laboratory will contact her with billing options and information about patient assistance programs.  If the out of pocket cost of testing is less than $100, she will be billed by the genetic testing laboratory.    PLAN: After considering the risks, benefits, and limitations, Lisa Price provided  informed consent to pursue genetic testing and the blood sample was sent to [facility] for analysis of the [place] + RNAinsight. Results should be available within approximately 1-2 weeks' time, at which point they will be disclosed by telephone to Ms. Melvin, as will any additional recommendations warranted by these results. Ms. Melby will receive a summary of her genetic counseling visit and a copy of her results once available. This information will also be available in Epic.   Lastly, we encouraged Ms. Rivest to remain in contact with cancer genetics annually so that we can continuously update the family history and inform her of any changes in cancer genetics and testing that may be of benefit for this family.   Ms. Elizarraraz questions were answered to her satisfaction today. Our contact information was provided should additional questions or concerns arise. Thank you for the referral and allowing [place] to share in the care of your patient.   Terrianne Cavness M. Joette Catching, [place], [facility] Genetic Counselor [email or web address] (P) [phone]  The patient was seen for a total of 20 minutes in audio and video genetic counseling.  Drs. Magrinat, Lindi Adie and/or Burr Medico were available to discuss this case as needed.   _______________________________________________________________________ For Office Staff:  Number of people involved in session: 1 Was an Intern/ student involved with case: no

## 2020-12-25 NOTE — Therapy (Signed)
[place], [place], [address] Phone: [phone]   Fax:  [phone]  Physical Therapy Evaluation  Patient Details  Name: Lisa Price MRN: [id number] Date of Birth: 1956-08-10 Referring Provider (PT): Dr. Coralie Keens   Encounter Date: 12/25/2020   PT End of Session - 12/25/20 1105    Visit Number 1    Number of Visits 2    Date for PT Re-Evaluation 02/19/21    PT Start Time 0945    PT Stop Time 1000   Also saw pt from 1042-1102 for a total of 35 min   PT Time Calculation (min) 15 min    Activity Tolerance Patient tolerated treatment well    Behavior During Therapy [facility] for tasks assessed/performed           Past Medical History:  Diagnosis Date  . Anxiety   . Depression   . Hiatal hernia   . Sleep apnea     Past Surgical History:  Procedure Laterality Date  . ABDOMINAL HYSTERECTOMY      There were no vitals filed for this visit.    Subjective Assessment - 12/25/20 0912    Subjective Patient reports she is here today to be seen by her medical team for her newly diagnosed left breast cancer.    Patient is accompained by: Family member    Pertinent History Patient was diagnosed on 11/20/2021 with left invasive ductal carcinoma breast cancer. It measures 8 mm and is located in the upper outer quadrant. It is ER/PR positive and HER2 is pending. Her Ki67 is 3%. She has chronic stomach pain which interferes with daily activities and is also limited by depression and anxiety.    Patient Stated Goals Reduce lymphedema risk and learn post op shoulder ROM HEP    Currently in Pain? Yes    Pain Score --   Varies   Pain Location Abdomen    Pain Orientation Right;Left    Pain Descriptors / Indicators Sharp    Pain Type Chronic pain    Pain Onset More than a month ago    Pain Frequency Intermittent    Aggravating Factors  Unknown    Pain Relieving Factors Unknown              [facility] PT  Assessment - 12/25/20 0001      Assessment   Medical Diagnosis Left breast cancer    Referring Provider (PT) Dr. Coralie Keens    Onset Date/Surgical Date 11/20/21    Hand Dominance Right    Prior Therapy none      Precautions   Precautions Other (comment)    Precaution Comments active cancer      Restrictions   Weight Bearing Restrictions No      Balance Screen   Has the patient fallen in the past 6 months No    Has the patient had a decrease in activity level because of a fear of falling?  No    Is the patient reluctant to leave their home because of a fear of falling?  No      Home Environment   Living Environment Private residence    Living Arrangements Spouse/significant other    Available Help at Discharge Family      Prior Function   Level of [place] Unemployed    Leisure She does not exercise      Cognition   Overall Cognitive Status Within Functional Limits for  tasks assessed      Posture/Postural Control   Posture/Postural Control Postural limitations    Postural Limitations Rounded Shoulders;Forward head      ROM / Strength   AROM / PROM / Strength AROM;Strength      AROM   Overall AROM Comments Cervical AROM is WNL    AROM Assessment Site Shoulder    Right/Left Shoulder Right;Left    Right Shoulder Extension 56 Degrees    Right Shoulder Flexion 156 Degrees    Right Shoulder ABduction 154 Degrees    Right Shoulder Internal Rotation 66 Degrees    Right Shoulder External Rotation 90 Degrees    Left Shoulder Extension 49 Degrees    Left Shoulder Flexion 146 Degrees    Left Shoulder ABduction 155 Degrees    Left Shoulder Internal Rotation 72 Degrees    Left Shoulder External Rotation 90 Degrees      Strength   Overall Strength Within functional limits for tasks performed             LYMPHEDEMA/ONCOLOGY QUESTIONNAIRE - 12/25/20 0001      Type   Cancer Type Left breast cancer      Lymphedema Assessments    Lymphedema Assessments Upper extremities      Right Upper Extremity Lymphedema   10 cm Proximal to Olecranon Process 30.4 cm    Olecranon Process 27.1 cm    10 cm Proximal to Ulnar Styloid Process 23.9 cm    Just Proximal to Ulnar Styloid Process 17.3 cm    Across Hand at [employer] 20.8 cm    At [place] of 2nd Digit 6.6 cm      Left Upper Extremity Lymphedema   10 cm Proximal to Olecranon Process 30.4 cm    Olecranon Process 25 cm    10 cm Proximal to Ulnar Styloid Process 22.8 cm    Just Proximal to Ulnar Styloid Process 16.8 cm    Across Hand at [employer] 19.9 cm    At [place] of 2nd Digit 6.4 cm           L-DEX FLOWSHEETS - 12/25/20 1100      L-DEX LYMPHEDEMA SCREENING   Measurement Type Unilateral    L-DEX MEASUREMENT EXTREMITY Upper Extremity    POSITION  Standing    DOMINANT SIDE Right    At Risk Side Left    BASELINE SCORE (UNILATERAL) -2.6           The patient was assessed using the L-Dex machine today to produce a lymphedema index baseline score. The patient will be reassessed on a regular basis (typically every 3 months) to obtain new L-Dex scores. If the score is > 6.5 points away from his/her baseline score indicating onset of subclinical lymphedema, it will be recommended to wear a compression garment for 4 weeks, 12 hours per day and then be reassessed. If the score continues to be > 6.5 points from baseline at reassessment, we will initiate lymphedema treatment. Assessing in this manner has a 95% rate of preventing clinically significant lymphedema.      Katina Dung - 12/25/20 0001    Open a tight or new jar No difficulty    Do heavy household chores (wash walls, wash floors) No difficulty    Carry a shopping bag or briefcase No difficulty    Wash your back No difficulty    Use a knife to cut food No difficulty    Recreational activities in which you take some force  or impact through your arm, shoulder, or hand (golf, hammering, tennis) No difficulty     During the past week, to what extent has your arm, shoulder or hand problem interfered with your normal social activities with family, friends, neighbors, or groups? Not at all    During the past week, to what extent has your arm, shoulder or hand problem limited your work or other regular daily activities Not at all    Arm, shoulder, or hand pain. None    Tingling (pins and needles) in your arm, shoulder, or hand None    Difficulty Sleeping No difficulty    DASH Score 0 %            Objective measurements completed on examination: See above findings.        Patient was instructed today in a home exercise program today for post op shoulder range of motion. These included active assist shoulder flexion in sitting, scapular retraction, wall walking with shoulder abduction, and hands behind head external rotation.  She was encouraged to do these twice a day, holding 3 seconds and repeating 5 times when permitted by her physician.           PT Education - 12/25/20 1104    Education Details Lymphedema risk reduction and post op shoulder ROM HEP    Person(s) Educated Patient;Spouse    Methods Explanation;Demonstration;Handout    Comprehension Returned demonstration;Verbalized understanding               PT Long Term Goals - 12/25/20 1108      PT LONG TERM GOAL #1   Title Patient will demonstrate she has regained full shoulder ROM and function post operatively compared to baselines.    Time 8    Period Weeks    Target Date 02/19/21           Breast Clinic Goals - 12/25/20 1108      Patient will be able to verbalize understanding of pertinent lymphedema risk reduction practices relevant to her diagnosis specifically related to skin care.   Time 1    Period Days    Status Achieved      Patient will be able to return demonstrate and/or verbalize understanding of the post-op home exercise program related to regaining shoulder range of motion.   Time 1    Period  Days    Status Achieved      Patient will be able to verbalize understanding of the importance of attending the postoperative After Breast Cancer Class for further lymphedema risk reduction education and therapeutic exercise.   Time 1    Period Days    Status Achieved                 Plan - 12/25/20 1105    Clinical Impression Statement Patient was diagnosed on 11/20/2021 with left invasive ductal carcinoma breast cancer. It measures 8 mm and is located in the upper outer quadrant. It is ER/PR positive and HER2 is pending. Her Ki67 is 3%. She has chronic stomach pain which interferes with daily activities and is also limited by depression and anxiety. Her multidisciplinary medical team met prior ot hre assessments to determine a recommended treatment plan. She is planning to have a left lumpectomy and sentinel node biopsy followed by Oncotype testing, radiation, and anti-estrogen therapy. She will benefit from a post op PT reassessment to determine needs and from L-Dex screens every 3 months for 2 years to detect subclinical lymphedema.  Stability/Clinical Decision Making Stable/Uncomplicated    Clinical Decision Making Low    Rehab Potential Excellent    PT Frequency --   Eval and 1 f/u visit   PT Treatment/Interventions ADLs/Self Care Home Management;Therapeutic exercise;Patient/family education    PT Next Visit Plan Will reassess 3-4 weeks post op to determine needs    PT Home Exercise Plan Post op shoulder ROM HEP    Consulted and Agree with Plan of Care Patient;Family member/caregiver    Family Member Consulted Husband           Patient will benefit from skilled therapeutic intervention in order to improve the following deficits and impairments:  Postural dysfunction,Decreased range of motion,Decreased knowledge of precautions,Impaired UE functional use,Pain  Visit Diagnosis: Malignant neoplasm of upper-outer quadrant of left breast in female, estrogen receptor positive  ([place]) - Plan: PT plan of care cert/re-cert  Abnormal posture - Plan: PT plan of care cert/re-cert   Patient will follow up at outpatient cancer rehab 3-4 weeks following surgery.  If the patient requires physical therapy at that time, a specific plan will be dictated and sent to the referring physician for approval. The patient was educated today on appropriate basic range of motion exercises to begin post operatively and the importance of attending the After Breast Cancer class following surgery.  Patient was educated today on lymphedema risk reduction practices as it pertains to recommendations that will benefit the patient immediately following surgery.  She verbalized good understanding.      Problem List Patient Active Problem List   Diagnosis Date Noted  . Malignant neoplasm of upper-outer quadrant of left breast in female, estrogen receptor positive ([place]) 12/24/2020   Annia Friendly, PT 12/25/20 11:16 AM  [place], [place], [address] Phone: [phone]   Fax:  [phone]  Name: SHANICKA OLDENKAMP MRN: [id number] Date of Birth: [birth year]

## 2020-12-26 ENCOUNTER — Telehealth: Payer: Self-pay | Admitting: Hematology and Oncology

## 2020-12-26 NOTE — Telephone Encounter (Signed)
No 1/19 los, no cahnges made to pt schedule

## 2020-12-27 ENCOUNTER — Telehealth: Payer: Self-pay | Admitting: Hematology and Oncology

## 2020-12-27 ENCOUNTER — Other Ambulatory Visit: Payer: Self-pay | Admitting: Surgery

## 2020-12-27 DIAGNOSIS — Z853 Personal history of malignant neoplasm of breast: Secondary | ICD-10-CM

## 2020-12-27 NOTE — Telephone Encounter (Signed)
Left message with upcoming appointment. Gave option to call back to reschedule if needed. 

## 2021-01-02 ENCOUNTER — Encounter: Payer: Self-pay | Admitting: *Deleted

## 2021-01-02 ENCOUNTER — Telehealth: Payer: Self-pay | Admitting: *Deleted

## 2021-01-02 NOTE — Telephone Encounter (Signed)
Spoke with patient to follow up from [facility] 1/19 and assess navigation needs. Patient needed some clarification with her upcoming appointments and I have reviewed these with her and she verbalized understanding.  Encouraged her to call with any other questions or concerns.

## 2021-01-03 ENCOUNTER — Telehealth: Payer: Self-pay | Admitting: Genetic Counselor

## 2021-01-03 ENCOUNTER — Encounter: Payer: Self-pay | Admitting: Genetic Counselor

## 2021-01-03 DIAGNOSIS — Z1379 Encounter for other screening for genetic and chromosomal anomalies: Secondary | ICD-10-CM | POA: Insufficient documentation

## 2021-01-03 NOTE — Telephone Encounter (Signed)
Revealed negative genetic testing.  Discussed that we do not know why she has breast cancer or why there is cancer in the family. It could be sporadic/familial, due to a different gene that we are not testing, or maybe our current technology may not be able to pick something up.  It will be important for her to keep in contact with genetics to keep up with whether additional testing may be needed.  Results of [facility] CancerNext-Expanded +RNAinsight Panel are pending.

## 2021-01-06 ENCOUNTER — Encounter (HOSPITAL_BASED_OUTPATIENT_CLINIC_OR_DEPARTMENT_OTHER): Payer: Self-pay | Admitting: Surgery

## 2021-01-06 ENCOUNTER — Other Ambulatory Visit: Payer: Self-pay

## 2021-01-07 ENCOUNTER — Encounter: Payer: Self-pay | Admitting: Genetic Counselor

## 2021-01-07 ENCOUNTER — Ambulatory Visit: Payer: Self-pay | Admitting: Genetic Counselor

## 2021-01-07 ENCOUNTER — Telehealth: Payer: Self-pay | Admitting: Genetic Counselor

## 2021-01-07 ENCOUNTER — Other Ambulatory Visit (HOSPITAL_COMMUNITY)
Admission: RE | Admit: 2021-01-07 | Discharge: 2021-01-07 | Disposition: A | Discharge status: Home / Self Care | Payer: BC Managed Care – PPO | Source: Ambulatory Visit | Attending: Surgery | Admitting: Surgery

## 2021-01-07 DIAGNOSIS — C50412 Malignant neoplasm of upper-outer quadrant of left female breast: Secondary | ICD-10-CM | POA: Diagnosis present

## 2021-01-07 DIAGNOSIS — Z170 Estrogen receptor positive status [ER+]: Secondary | ICD-10-CM | POA: Diagnosis not present

## 2021-01-07 DIAGNOSIS — Z20822 Contact with and (suspected) exposure to covid-19: Secondary | ICD-10-CM | POA: Insufficient documentation

## 2021-01-07 DIAGNOSIS — Z01812 Encounter for preprocedural laboratory examination: Secondary | ICD-10-CM | POA: Insufficient documentation

## 2021-01-07 DIAGNOSIS — Z803 Family history of malignant neoplasm of breast: Secondary | ICD-10-CM

## 2021-01-07 DIAGNOSIS — Z1379 Encounter for other screening for genetic and chromosomal anomalies: Secondary | ICD-10-CM

## 2021-01-07 DIAGNOSIS — Z808 Family history of malignant neoplasm of other organs or systems: Secondary | ICD-10-CM

## 2021-01-07 HISTORY — PX: BREAST LUMPECTOMY: SHX2

## 2021-01-07 LAB — SARS CORONAVIRUS 2 (TAT 6-24 HRS): SARS Coronavirus 2: NEGATIVE

## 2021-01-07 NOTE — Telephone Encounter (Signed)
Revealed negative genetic testing for [facility] CancerNext-Expanded +RNAinsight Panel.  Discussed that we do not know why she has breast cancer or why there is cancer in the family. It could be sporadic/familial, due to a different gene that we are not testing, or maybe our current technology may not be able to pick something up.  It will be important for her to keep in contact with genetics to keep up with whether additional testing may be needed.

## 2021-01-07 NOTE — Progress Notes (Signed)
HPI:  [name] was previously seen in the [place] clinic due to a personal and family history of cancer and concerns regarding a hereditary predisposition to cancer. Please refer to our prior cancer genetics clinic note for more information regarding our discussion, assessment and recommendations, at the time. [name] recent genetic test results were disclosed to her, as were recommendations warranted by these results. These results and recommendations are discussed in more detail below.  CANCER HISTORY:  Oncology History  Malignant neoplasm of upper-outer quadrant of left breast in female, estrogen receptor positive ([place])  12/24/2020 Initial Diagnosis   Screening mammogram showed a 0.8cm mass at the 2 o'clock position in the left breast and no left axillary adenopathy. Biopsy showed invasive and in situ carcinoma, grade 2, HER-2 equivocal by IHC (2+), ER/PR+ >95%, Ki67 3%.   12/25/2020 Cancer Staging   Staging form: Breast, AJCC 8th Edition - Clinical stage from 12/25/2020: Stage IA (cT1b, cN0, cM0, G2, ER+, PR+, HER2: Equivocal) - Signed by Nicholas Lose, MD on 12/25/2020   01/01/2021 Genetic Testing   Negative genetic testing: no pathogenic variants detected in [facility] Panel.  The report date is January 01, 2021.    The BRCAplus panel offered by [employer] and includes sequencing and deletion/duplication analysis for the following 8 genes: ATM, BRCA1, BRCA2, CDH1, CHEK2, PALB2, PTEN, and TP53.    Negative genetic testing: no pathogenic variants detected in [facility] CancerNext-Expanded +RNAinsight.  The report date is January 05, 2021.    The CancerNext-Expanded gene panel offered by [facility] and includes sequencing, rearrangement, and RNA analysis for the following 77 genes: AIP, ALK, APC, ATM, AXIN2, BAP1, BARD1, BLM, BMPR1A, BRCA1, BRCA2, BRIP1, CDC73, CDH1, CDK4, CDKN1B, CDKN2A, CHEK2, CTNNA1, DICER1, FANCC, FH, FLCN, GALNT12, KIF1B, LZTR1, MAX, MEN1, MET,  MLH1, MSH2, MSH3, MSH6, MUTYH, NBN, NF1, NF2, NTHL1, PALB2, PHOX2B, PMS2, POT1, PRKAR1A, PTCH1, PTEN, RAD51C, RAD51D, RB1, RECQL, RET, SDHA, SDHAF2, SDHB, SDHC, SDHD, SMAD4, SMARCA4, SMARCB1, SMARCE1, STK11, SUFU, TMEM127, TP53, TSC1, TSC2, VHL and XRCC2 (sequencing and deletion/duplication); EGFR, EGLN1, HOXB13, KIT, MITF, PDGFRA, POLD1, and POLE (sequencing only); EPCAM and GREM1 (deletion/duplication only).      FAMILY HISTORY:  We obtained a detailed, 4-generation family history.  Significant diagnoses are listed below: Family History  Problem Relation Age of Onset  . Melanoma Mother   . Thyroid cancer Father        unknown age diagnosed  . Breast cancer Paternal Grandmother        dx before age 27  . Thyroid cancer Maternal Grandmother        dx after age 24    [name] has one daughter, Lisa Price, who is 24. [name] has three brothers and one sister.  She has limited contact with her siblings but does not report any cancer.    [name]' mother had a history of melanoma.   Her maternal grandmother had a history of thyroid cancer after the age of 57.  No other maternal family history of cancer was reported.  [name] had limited information about her maternal aunts, uncles, and cousins.   [name] reported thyroid cancer in her father diagnosed at an unknown age.  [name] paternal grandmother was diagnosed with breast cancer before the age of 77.  [name] had limited information about other paternal family members.   [name] is unaware of previous family history of genetic testing for hereditary cancer risks. Patient's maternal ancestors are of [place]  descent, and paternal ancestors are of [place] descent. There is no reported Ashkenazi Jewish ancestry. There is no known consanguinity.   GENETIC TEST RESULTS: Genetic testing reported out on January 05, 2021.  The [facility] CancerNext-Expanded +RNAinsight Panel found no pathogenic mutations. The CancerNext-Expanded gene panel  offered by [facility] and includes sequencing, rearrangement, and RNA analysis for the following 77 genes: AIP, ALK, APC, ATM, AXIN2, BAP1, BARD1, BLM, BMPR1A, BRCA1, BRCA2, BRIP1, CDC73, CDH1, CDK4, CDKN1B, CDKN2A, CHEK2, CTNNA1, DICER1, FANCC, FH, FLCN, GALNT12, KIF1B, LZTR1, MAX, MEN1, MET, MLH1, MSH2, MSH3, MSH6, MUTYH, NBN, NF1, NF2, NTHL1, PALB2, PHOX2B, PMS2, POT1, PRKAR1A, PTCH1, PTEN, RAD51C, RAD51D, RB1, RECQL, RET, SDHA, SDHAF2, SDHB, SDHC, SDHD, SMAD4, SMARCA4, SMARCB1, SMARCE1, STK11, SUFU, TMEM127, TP53, TSC1, TSC2, VHL and XRCC2 (sequencing and deletion/duplication); EGFR, EGLN1, HOXB13, KIT, MITF, PDGFRA, POLD1, and POLE (sequencing only); EPCAM and GREM1 (deletion/duplication only).   The test report has been scanned into EPIC and is located under the Molecular Pathology section of the Results Review tab.  A portion of the result report is included below for reference.     We discussed with [name] that because current genetic testing is not perfect, it is possible there may be a gene mutation in one of these genes that current testing cannot detect, but that chance is small.  We also discussed, that there could be another gene that has not yet been discovered, or that we have not yet tested, that is responsible for the cancer diagnoses in the family. It is also possible there is a hereditary cause for the cancer in the family that [name] did not inherit and therefore was not identified in her testing.  Therefore, it is important to remain in touch with cancer genetics in the future so that we can continue to offer [name] the most up to date genetic testing.   ADDITIONAL GENETIC TESTING: We discussed with [name] that her genetic testing was fairly extensive.  If there are genes identified to increase cancer risk that can be analyzed in the future, we would be happy to discuss and coordinate this testing at that time.    CANCER SCREENING RECOMMENDATIONS: [name] test  result is considered negative (normal).  This means that we have not identified a hereditary cause for her personal history of cancer at this time. Most cancers happen by chance and this negative test suggests that her cancer may fall into this category.    While reassuring, this does not definitively rule out a hereditary predisposition to cancer. It is still possible that there could be genetic mutations that are undetectable by current technology. There could be genetic mutations in genes that have not been tested or identified to increase cancer risk.  Therefore, it is recommended she continue to follow the cancer management and screening guidelines provided by heroncology and primary healthcare provider.   An individual's cancer risk and medical management are not determined by genetic test results alone. Overall cancer risk assessment incorporates additional factors, including personal medical history, family history, and any available genetic information that may result in a personalized plan for cancer prevention and surveillance  RECOMMENDATIONS FOR FAMILY MEMBERS:  Individuals in this family might be at some increased risk of developing cancer, over the general population risk, simply due to the family history of cancer.  We recommended women in this family have a yearly mammogram beginning at age 41, or 18 years younger than the earliest onset of cancer, an annual clinical breast  exam, and perform monthly breast self-exams. Women in this family should also have a gynecological exam as recommended by their primary provider. All family members should be referred for colonoscopy starting at age 64.  It is also possible there is a hereditary cause for the cancer in [name]'s family that she did not inherit and therefore was not identified in her.  Based on [name]'s family history, we recommend relatives of her paternal grandmother, who was diagnosed with breast cancer before the age of 21, have  genetic counseling and testing. [name] will let us know if we can be of any assistance in coordinating genetic counseling and/or testing for others in the family.  FOLLOW-UP: Lastly, we discussed with [name] that cancer genetics is a rapidly advancing field and it is possible that new genetic tests will be appropriate for her and/or her family members in the future. We encouraged her to remain in contact with cancer genetics on an annual basis so we can update her personal and family histories and let her know of advances in cancer genetics that may benefit this family.   Our contact number was provided. [name] questions were answered to her satisfaction, and she knows she is welcome to call us at anytime with additional questions or concerns.    Esequiel Kleinfelter M. Joette Catching, [place], [facility] Genetic Counselor [email or web address] (P) [phone]

## 2021-01-09 ENCOUNTER — Ambulatory Visit
Admission: RE | Admit: 2021-01-09 | Discharge: 2021-01-09 | Disposition: A | Discharge status: Home / Self Care | Payer: BC Managed Care – PPO | Source: Ambulatory Visit | Attending: Surgery | Admitting: Surgery

## 2021-01-09 ENCOUNTER — Other Ambulatory Visit: Payer: Self-pay

## 2021-01-09 DIAGNOSIS — Z853 Personal history of malignant neoplasm of breast: Secondary | ICD-10-CM

## 2021-01-09 NOTE — H&P (Signed)
[facility] Appointment: 12/25/2020 9:00 AM Location: [facility] Patient #: [id number] DOB: Jul 13, 1956 Undefined / Language: Suszanne Conners / Race: White Female   History of Present Illness (Anyia Gierke A. Ninfa Linden MD; 12/25/2020 10:20 AM) The patient is a 65 year old female who presents with breast cancer.  Chief complaint: Left breast cancer  This is a 65 year old female who was recently found to have an abnormality on screening mammography of the left breast. She underwent further imaging showing a 0.8 cm lesion at the 2 o'clock position of the left breast. 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 It was greater than 95% ER and PR positive with a Ki-67 of 3%. HER2 status is pending. She has had no problems with the breast. She denies nipple discharge. She does have a family history of breast cancer in her paternal grandmother. She has had no previous problems with general anesthesia with past surgeries.    Past Surgical History Conni Slipper, RN; 12/25/2020 8:07 AM) Breast Biopsy  Left. Hysterectomy (not due to cancer) - Complete   Diagnostic Studies History Conni Slipper, RN; 12/25/2020 8:07 AM) Colonoscopy  within last year Mammogram  within last year Pap Smear  1-5 years ago  Medication History Conni Slipper, RN; 12/25/2020 8:07 AM) Medications Reconciled  Social History Conni Slipper, RN; 12/25/2020 8:07 AM) Alcohol use  Occasional alcohol use. Caffeine use  Carbonated beverages. No drug use  Tobacco use  Never smoker.  Family History Conni Slipper, RN; 12/25/2020 8:07 AM) Bleeding disorder  Family Members In General. Cerebrovascular Accident  Mother. Depression  Brother, Mother. Melanoma  Mother. Migraine Headache  Brother, Father. Thyroid problems  Family Members In General.  Pregnancy / Birth History Conni Slipper, RN; 12/25/2020 8:07 AM) Age at menarche  65 years. Age of menopause  80-55 Gravida  1 Maternal age  58-40 Para   1 Regular periods   Other Problems Conni Slipper, RN; 12/25/2020 8:07 AM) Anxiety Disorder  Depression  Gastroesophageal Reflux Disease  Hypercholesterolemia  Sleep Apnea     Review of Systems Conni Slipper RN; 12/25/2020 8:07 AM) General Present- Fatigue and Night Sweats. Not Present- Appetite Loss, Chills, Fever, Weight Gain and Weight Loss. Skin Not Present- Change in Wart/Mole, Dryness, Hives, Jaundice, New Lesions, Non-Healing Wounds, Rash and Ulcer. HEENT Not Present- Earache, Hearing Loss, Hoarseness, Nose Bleed, Oral Ulcers, Ringing in the Ears, Seasonal Allergies, Sinus Pain, Sore Throat, Visual Disturbances, Wears glasses/contact lenses and Yellow Eyes. Breast Not Present- Breast Mass, Breast Pain, Nipple Discharge and Skin Changes. Cardiovascular Not Present- Chest Pain, Difficulty Breathing Lying Down, Leg Cramps, Palpitations, Rapid Heart Rate, Shortness of Breath and Swelling of Extremities. Gastrointestinal Present- Abdominal Pain, Bloody Stool, Chronic diarrhea and Constipation. Not Present- Bloating, Change in Bowel Habits, Difficulty Swallowing, Excessive gas, Gets full quickly at meals, Hemorrhoids, Indigestion, Nausea, Rectal Pain and Vomiting. Female Genitourinary Present- Pelvic Pain. Not Present- Frequency, Nocturia, Painful Urination and Urgency. Musculoskeletal Not Present- Back Pain, Joint Pain, Joint Stiffness, Muscle Pain, Muscle Weakness and Swelling of Extremities. Neurological Not Present- Decreased Memory, Fainting, Headaches, Numbness, Seizures, Tingling, Tremor, Trouble walking and Weakness. Psychiatric Present- Anxiety and Depression. Not Present- Bipolar, Change in Sleep Pattern, Fearful and Frequent crying. Endocrine Present- Heat Intolerance. Not Present- Cold Intolerance, Excessive Hunger, Hair Changes, Hot flashes and New Diabetes. Hematology Present- Gland problems. Not Present- Blood Thinners, Easy Bruising, Excessive bleeding, HIV and Persistent  Infections.   Physical Exam (Atziry Baranski A. Ninfa Linden MD; 12/25/2020 10:21 AM) The physical exam findings are  as follows: Note: She appears comfortable on exam  There is ecchymosis at the biopsy site of the left breast. There are no palpable masses. The nipple areolar complexes are normal.  There is no axillary adenopathy.    Assessment & Plan (Jaryn Rosko A. Ninfa Linden MD; 12/25/2020 10:24 AM) BREAST CANCER, LEFT (C50.912) Impression: I have reviewed her notes in the electronic medical records. We have discussed her extensively at our multidisciplinary breast cancer conference this morning.  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 I had discussion with the patient and her husband regarding breast cancer. We discussed both breast conservation with lumpectomy versus mastectomy and the long-term outcome of both. She is interested in breast conservation. I then had a discussion with her regarding proceeding with a radioactive seed guided left breast lumpectomy. I discussed the surgical procedure in detail. As this is invasive cancer we also recommend biopsying lymph nodes in the left axilla. This would be with a sentinel lymph node biopsy. I again discussed both surgical procedures in detail. We discussed the risks which includes but is not limited to bleeding, infection, the need for further surgery on the breast if margins are positive, cardiopulmonary issues, left arm swelling, postoperative recovery, etc. Again, she is interested in breast conservation so surgery will be scheduled. We did discuss her HER2 status. If it does come back positive we would also have to add a Port-A-Cath insertion to her procedure. We briefly discussed this as well.

## 2021-01-09 NOTE — Progress Notes (Signed)
Pt's husband given ensure presurgery with instruction to complete drink DOS by 0430. Also given CHG soap with instruction to shower using half night before and half morning of surgery. Husband verbalized understanding and will tell wife.          Enhanced Recovery after Surgery for Orthopedics Enhanced Recovery after Surgery is a protocol used to improve the stress on your body and your recovery after surgery.  Patient Instructions  . The night before surgery:  o No food after midnight. ONLY clear liquids after midnight  . The day of surgery (if you do NOT have diabetes):  o Drink ONE (1) Pre-Surgery Clear Ensure as directed.   o This drink was given to you during your hospital  pre-op appointment visit. o The pre-op nurse will instruct you on the time to drink the  Pre-Surgery Ensure depending on your surgery time. o Finish the drink at the designated time by the pre-op nurse.  o Nothing else to drink after completing the  Pre-Surgery Clear Ensure.  . The day of surgery (if you have diabetes): o Drink ONE (1) Gatorade 2 (G2) as directed. o This drink was given to you during your hospital  pre-op appointment visit.  o The pre-op nurse will instruct you on the time to drink the   Gatorade 2 (G2) depending on your surgery time. o Color of the Gatorade may vary. Red is not allowed. o Nothing else to drink after completing the  Gatorade 2 (G2).         If you have questions, please contact your surgeon's office.

## 2021-01-09 NOTE — Anesthesia Preprocedure Evaluation (Addendum)
Anesthesia Evaluation  Patient identified by MRN, date of birth, ID band Patient awake    Reviewed: Allergy & Precautions, NPO status , Patient's Chart, lab work & pertinent test results  Airway Mallampati: II  TM Distance: >3 FB Neck ROM: Full    Dental  (+) Dental Advisory Given   Pulmonary sleep apnea ,    Pulmonary exam normal breath sounds clear to auscultation       Cardiovascular negative cardio ROS Normal cardiovascular exam Rhythm:Regular Rate:Normal     Neuro/Psych PSYCHIATRIC DISORDERS Anxiety Depression negative neurological ROS     GI/Hepatic Neg liver ROS, hiatal hernia,   Endo/Other  negative endocrine ROS  Renal/GU negative Renal ROS     Musculoskeletal negative musculoskeletal ROS (+)   Abdominal (+) + obese,   Peds  Hematology negative hematology ROS (+)   Anesthesia Other Findings   Reproductive/Obstetrics                            Anesthesia Physical Anesthesia Plan  ASA: II  Anesthesia Plan: General   Post-op Pain Management: GA combined w/ Regional for post-op pain   Induction: Intravenous  PONV Risk Score and Plan: 4 or greater and Midazolam, Scopolamine patch - Pre-op, Ondansetron, Dexamethasone, Propofol infusion and Treatment may vary due to age or medical condition  Airway Management Planned: LMA  Additional Equipment: None  Intra-op Plan:   Post-operative Plan: Extubation in OR  Informed Consent: I have reviewed the patients History and Physical, chart, labs and discussed the procedure including the risks, benefits and alternatives for the proposed anesthesia with the patient or authorized representative who has indicated his/her understanding and acceptance.     Dental advisory given  Plan Discussed with: CRNA  Anesthesia Plan Comments:        Anesthesia Quick Evaluation

## 2021-01-10 ENCOUNTER — Other Ambulatory Visit: Payer: Self-pay

## 2021-01-10 ENCOUNTER — Encounter (HOSPITAL_BASED_OUTPATIENT_CLINIC_OR_DEPARTMENT_OTHER)
Admission: RE | Disposition: A | Discharge status: Home / Self Care | Payer: Self-pay | Source: Home / Self Care | Attending: Surgery

## 2021-01-10 ENCOUNTER — Ambulatory Visit (HOSPITAL_BASED_OUTPATIENT_CLINIC_OR_DEPARTMENT_OTHER): Payer: BC Managed Care – PPO | Admitting: Anesthesiology

## 2021-01-10 ENCOUNTER — Ambulatory Visit (HOSPITAL_BASED_OUTPATIENT_CLINIC_OR_DEPARTMENT_OTHER)
Admission: RE | Admit: 2021-01-10 | Discharge: 2021-01-10 | Disposition: A | Discharge status: Home / Self Care | Payer: BC Managed Care – PPO | Attending: Surgery | Admitting: Surgery

## 2021-01-10 ENCOUNTER — Ambulatory Visit
Admission: RE | Admit: 2021-01-10 | Discharge: 2021-01-10 | Disposition: A | Discharge status: Home / Self Care | Payer: BC Managed Care – PPO | Source: Ambulatory Visit | Attending: Surgery | Admitting: Surgery

## 2021-01-10 ENCOUNTER — Encounter (HOSPITAL_COMMUNITY)
Admission: RE | Admit: 2021-01-10 | Discharge: 2021-01-10 | Disposition: A | Discharge status: Home / Self Care | Payer: BC Managed Care – PPO | Source: Ambulatory Visit | Attending: Surgery | Admitting: Surgery

## 2021-01-10 ENCOUNTER — Encounter (HOSPITAL_BASED_OUTPATIENT_CLINIC_OR_DEPARTMENT_OTHER): Payer: Self-pay | Admitting: Surgery

## 2021-01-10 DIAGNOSIS — C50412 Malignant neoplasm of upper-outer quadrant of left female breast: Secondary | ICD-10-CM | POA: Insufficient documentation

## 2021-01-10 DIAGNOSIS — Z20822 Contact with and (suspected) exposure to covid-19: Secondary | ICD-10-CM | POA: Insufficient documentation

## 2021-01-10 DIAGNOSIS — Z803 Family history of malignant neoplasm of breast: Secondary | ICD-10-CM | POA: Insufficient documentation

## 2021-01-10 DIAGNOSIS — Z853 Personal history of malignant neoplasm of breast: Secondary | ICD-10-CM

## 2021-01-10 DIAGNOSIS — C50912 Malignant neoplasm of unspecified site of left female breast: Secondary | ICD-10-CM | POA: Diagnosis present

## 2021-01-10 DIAGNOSIS — Z170 Estrogen receptor positive status [ER+]: Secondary | ICD-10-CM | POA: Insufficient documentation

## 2021-01-10 HISTORY — PX: BREAST LUMPECTOMY WITH RADIOACTIVE SEED AND SENTINEL LYMPH NODE BIOPSY: SHX6550

## 2021-01-10 SURGERY — BREAST LUMPECTOMY WITH RADIOACTIVE SEED AND SENTINEL LYMPH NODE BIOPSY
Anesthesia: General | Site: Breast | Laterality: Left

## 2021-01-10 MED ORDER — DEXAMETHASONE SODIUM PHOSPHATE 4 MG/ML IJ SOLN
INTRAMUSCULAR | Status: DC | PRN
  Administered 2021-01-10: 5 mg

## 2021-01-10 MED ORDER — TRAMADOL HCL 50 MG PO TABS: 50.0000 mg | ORAL_TABLET | Freq: Four times a day (QID) | ORAL | 0 refills | Status: DC | PRN

## 2021-01-10 MED ORDER — CEFAZOLIN SODIUM-DEXTROSE 2-4 GM/100ML-% IV SOLN
2.0000 g | INTRAVENOUS | Status: AC
  Administered 2021-01-10: 2 g via INTRAVENOUS

## 2021-01-10 MED ORDER — CHLORHEXIDINE GLUCONATE CLOTH 2 % EX PADS: 6.0000 | MEDICATED_PAD | Freq: Once | CUTANEOUS | Status: DC

## 2021-01-10 MED ORDER — BUPIVACAINE-EPINEPHRINE 0.5% -1:200000 IJ SOLN
INTRAMUSCULAR | Status: DC | PRN
  Administered 2021-01-10: 20 mL

## 2021-01-10 MED ORDER — DEXAMETHASONE SODIUM PHOSPHATE 10 MG/ML IJ SOLN
INTRAMUSCULAR | Status: DC | PRN
  Administered 2021-01-10: 5 mg via INTRAVENOUS

## 2021-01-10 MED ORDER — ACETAMINOPHEN 500 MG PO TABS
1000.0000 mg | ORAL_TABLET | ORAL | Status: AC
  Administered 2021-01-10: 1000 mg via ORAL

## 2021-01-10 MED ORDER — CLONIDINE HCL (ANALGESIA) 100 MCG/ML EP SOLN
EPIDURAL | Status: DC | PRN
  Administered 2021-01-10: 80 ug

## 2021-01-10 MED ORDER — ACETAMINOPHEN 500 MG PO TABS
ORAL_TABLET | ORAL | Status: AC
  Filled 2021-01-10: qty 2

## 2021-01-10 MED ORDER — FENTANYL CITRATE (PF) 100 MCG/2ML IJ SOLN
50.0000 ug | Freq: Once | INTRAMUSCULAR | Status: AC
  Administered 2021-01-10: 100 ug via INTRAVENOUS

## 2021-01-10 MED ORDER — CEFAZOLIN SODIUM-DEXTROSE 2-4 GM/100ML-% IV SOLN
INTRAVENOUS | Status: AC
  Filled 2021-01-10: qty 100

## 2021-01-10 MED ORDER — ONDANSETRON HCL 4 MG/2ML IJ SOLN
INTRAMUSCULAR | Status: AC
  Filled 2021-01-10: qty 2

## 2021-01-10 MED ORDER — FENTANYL CITRATE (PF) 100 MCG/2ML IJ SOLN: 25.0000 ug | INTRAMUSCULAR | Status: DC | PRN

## 2021-01-10 MED ORDER — DIPHENHYDRAMINE HCL 50 MG/ML IJ SOLN
INTRAMUSCULAR | Status: DC | PRN
  Administered 2021-01-10: 12.5 mg via INTRAVENOUS

## 2021-01-10 MED ORDER — BUPIVACAINE-EPINEPHRINE (PF) 0.5% -1:200000 IJ SOLN
INTRAMUSCULAR | Status: DC | PRN
  Administered 2021-01-10: 30 mL

## 2021-01-10 MED ORDER — PROMETHAZINE HCL 25 MG/ML IJ SOLN: 6.2500 mg | INTRAMUSCULAR | Status: DC | PRN

## 2021-01-10 MED ORDER — PROPOFOL 500 MG/50ML IV EMUL
INTRAVENOUS | Status: DC | PRN
  Administered 2021-01-10: 25 ug/kg/min via INTRAVENOUS

## 2021-01-10 MED ORDER — FENTANYL CITRATE (PF) 100 MCG/2ML IJ SOLN
INTRAMUSCULAR | Status: AC
  Filled 2021-01-10: qty 2

## 2021-01-10 MED ORDER — MEPERIDINE HCL 25 MG/ML IJ SOLN: 6.2500 mg | INTRAMUSCULAR | Status: DC | PRN

## 2021-01-10 MED ORDER — TECHNETIUM TC 99M SULFUR COLLOID FILTERED
1.0000 | Freq: Once | INTRAVENOUS | Status: AC | PRN
  Administered 2021-01-10: 1 via INTRADERMAL

## 2021-01-10 MED ORDER — MIDAZOLAM HCL 2 MG/2ML IJ SOLN
INTRAMUSCULAR | Status: AC
  Filled 2021-01-10: qty 2

## 2021-01-10 MED ORDER — LIDOCAINE 2% (20 MG/ML) 5 ML SYRINGE
INTRAMUSCULAR | Status: AC
  Filled 2021-01-10: qty 5

## 2021-01-10 MED ORDER — PROPOFOL 10 MG/ML IV BOLUS
INTRAVENOUS | Status: DC | PRN
  Administered 2021-01-10: 200 mg via INTRAVENOUS

## 2021-01-10 MED ORDER — CEFAZOLIN SODIUM-DEXTROSE 2-3 GM-%(50ML) IV SOLR
INTRAVENOUS | Status: DC | PRN
  Administered 2021-01-10: 2 g via INTRAVENOUS

## 2021-01-10 MED ORDER — LACTATED RINGERS IV SOLN: INTRAVENOUS | Status: DC

## 2021-01-10 MED ORDER — ONDANSETRON HCL 4 MG/2ML IJ SOLN
INTRAMUSCULAR | Status: DC | PRN
  Administered 2021-01-10: 4 mg via INTRAVENOUS

## 2021-01-10 MED ORDER — ENSURE PRE-SURGERY PO LIQD: 296.0000 mL | Freq: Once | ORAL | Status: DC

## 2021-01-10 SURGICAL SUPPLY — 49 items
ADH SKN CLS APL DERMABOND .7 (GAUZE/BANDAGES/DRESSINGS) ×1
APL PRP STRL LF DISP 70% ISPRP (MISCELLANEOUS) ×1
APPLIER CLIP 9.375 MED OPEN (MISCELLANEOUS) ×2
APR CLP MED 9.3 20 MLT OPN (MISCELLANEOUS) ×1
BINDER BREAST XLRG (GAUZE/BANDAGES/DRESSINGS) ×1 IMPLANT
BLADE SURG 15 STRL LF DISP TIS (BLADE) ×1 IMPLANT
BLADE SURG 15 STRL SS (BLADE) ×2
CANISTER SUCT 1200ML W/VALVE (MISCELLANEOUS) ×1 IMPLANT
CHLORAPREP W/TINT 26 (MISCELLANEOUS) ×2 IMPLANT
CLIP APPLIE 9.375 MED OPEN (MISCELLANEOUS) ×1 IMPLANT
CLIP VESOCCLUDE SM WIDE 6/CT (CLIP) ×1 IMPLANT
COVER BACK TABLE 60X90IN (DRAPES) ×2 IMPLANT
COVER MAYO STAND STRL (DRAPES) ×2 IMPLANT
COVER PROBE W GEL 5X96 (DRAPES) ×2 IMPLANT
COVER SURGICAL LIGHT HANDLE (MISCELLANEOUS) ×1 IMPLANT
COVER WAND RF STERILE (DRAPES) IMPLANT
DECANTER SPIKE VIAL GLASS SM (MISCELLANEOUS) ×1 IMPLANT
DERMABOND ADVANCED (GAUZE/BANDAGES/DRESSINGS) ×1
DERMABOND ADVANCED .7 DNX12 (GAUZE/BANDAGES/DRESSINGS) ×1 IMPLANT
DRAPE LAPAROSCOPIC ABDOMINAL (DRAPES) ×2 IMPLANT
DRAPE UTILITY XL STRL (DRAPES) ×2 IMPLANT
ELECT REM PT RETURN 9FT ADLT (ELECTROSURGICAL) ×2
ELECTRODE REM PT RTRN 9FT ADLT (ELECTROSURGICAL) ×1 IMPLANT
GAUZE SPONGE 4X4 12PLY STRL LF (GAUZE/BANDAGES/DRESSINGS) IMPLANT
GLOVE SURG ENC TEXT LTX SZ7 (GLOVE) ×1 IMPLANT
GLOVE SURG SIGNA 7.5 PF LTX (GLOVE) ×2 IMPLANT
GOWN STRL REUS W/ TWL LRG LVL3 (GOWN DISPOSABLE) ×1 IMPLANT
GOWN STRL REUS W/ TWL XL LVL3 (GOWN DISPOSABLE) ×1 IMPLANT
GOWN STRL REUS W/TWL LRG LVL3 (GOWN DISPOSABLE) ×6
GOWN STRL REUS W/TWL XL LVL3 (GOWN DISPOSABLE) ×2
KIT MARKER MARGIN INK (KITS) ×2 IMPLANT
NDL HYPO 25X1 1.5 SAFETY (NEEDLE) ×1 IMPLANT
NDL SAFETY ECLIPSE 18X1.5 (NEEDLE) ×1 IMPLANT
NEEDLE HYPO 18GX1.5 SHARP (NEEDLE)
NEEDLE HYPO 25X1 1.5 SAFETY (NEEDLE) ×2 IMPLANT
NS IRRIG 1000ML POUR BTL (IV SOLUTION) ×2 IMPLANT
PACK BASIN DAY SURGERY FS (CUSTOM PROCEDURE TRAY) ×2 IMPLANT
PENCIL SMOKE EVACUATOR (MISCELLANEOUS) ×2 IMPLANT
SLEEVE SCD COMPRESS KNEE MED (MISCELLANEOUS) ×2 IMPLANT
SPONGE LAP 4X18 RFD (DISPOSABLE) ×2 IMPLANT
SUT MNCRL AB 4-0 PS2 18 (SUTURE) ×3 IMPLANT
SUT SILK 2 0 SH (SUTURE) IMPLANT
SUT VIC AB 3-0 SH 27 (SUTURE) ×4
SUT VIC AB 3-0 SH 27X BRD (SUTURE) ×1 IMPLANT
SYR CONTROL 10ML LL (SYRINGE) ×2 IMPLANT
TOWEL GREEN STERILE FF (TOWEL DISPOSABLE) ×2 IMPLANT
TRAY FAXITRON CT DISP (TRAY / TRAY PROCEDURE) ×2 IMPLANT
TUBE CONNECTING 20X1/4 (TUBING) ×1 IMPLANT
YANKAUER SUCT BULB TIP NO VENT (SUCTIONS) ×1 IMPLANT

## 2021-01-10 NOTE — Anesthesia Procedure Notes (Signed)
Procedure Name: LMA Insertion Performed by: Verita Lamb, CRNA Pre-anesthesia Checklist: Patient identified, Emergency Drugs available, Suction available and Patient being monitored Patient Re-evaluated:Patient Re-evaluated prior to induction Oxygen Delivery Method: Circle system utilized Preoxygenation: Pre-oxygenation with 100% oxygen Induction Type: IV induction Ventilation: Mask ventilation without difficulty LMA: LMA inserted LMA Size: 4.0 Number of attempts: 1 Airway Equipment and Method: Bite block Placement Confirmation: positive ETCO2,  CO2 detector and breath sounds checked- equal and bilateral Tube secured with: Tape Dental Injury: Teeth and Oropharynx as per pre-operative assessment

## 2021-01-10 NOTE — Op Note (Signed)
   Lisa Price 01/10/2021   Pre-op Diagnosis: LEFT BREAST CANCER     Post-op Diagnosis: same  Procedure(s): LEFT BREAST RADIOACTIVE SEED GUIDED LUMPECTOMY DEEP LEFT AXILLARY SENTINEL LYMPH NODE BIOSPY  Surgeon(s): Coralie Keens, MD Carlena Hurl, PA-C  Anesthesia: General  Staff:  Circulator: McDonough-Hughes, Delene Ruffini, RN Scrub Person: Murvin Natal  Estimated Blood Loss: Minimal               Specimens: SENT TO PATH  Indications: This is a 65 year old female recently found to have a small mass in the left breast on screening mammography.  She underwent a biopsy of the mass showing an invasive ductal carcinoma which is ER/PR positive.  The decision was made to proceed with a radioactive seed guided left breast lumpectomy and deep axillary sentinel lymph node biopsy  Procedure: The patient was brought to the operating room and identifies the correct patient.  She is placed upon the operating table general anesthesia was induced.  Her left breast and axilla were prepped and draped in usual sterile fashion.  Using the neoprobe I located the radioactive seed at the 2 o'clock position of the left breast.  I anesthetized the skin around the areola with Marcaine and made a circumareolar incision with a scalpel.  I then dissected laterally toward the radioactive seed with aid of the neoprobe.  I then performed a wide lumpectomy staying around the radioactive seed with the neoprobe.  Once I completed the lumpectomy, I marked the margins with paint and x-rayed the specimen.  The radioactive seed and tissue marker were in the specimen.  The seed was near the posterior margin so I decided to take more posterior lateral margin going down the chest wall.  This was sent to pathology separately.  I then achieved hemostasis in the lumpectomy site with the cautery. I next identified an area of increased uptake in the left axilla.  I anesthetized the skin with Marcaine and made incision with a  scalpel.  I then dissected down to the deep left axillary tissue with the cautery.  With the aid of the neoprobe identified 2 sentinel lymph nodes which were excised together and sent to pathology for evaluation.  There were no enlarged palpable lymph nodes in the nodal basin showed no further increased uptake after these nodes were removed.  Hemostasis was then achieved.  I returned to the lumpectomy cavity and anesthetized the cavity with Marcaine.  Hemostasis appeared to be achieved.  I then placed surgical clips around the periphery of the lumpectomy site for marker purposes.  Both incisions were then closed with subcutaneous 3-0 Vicryl sutures and running 4-0 Monocryl sutures.  Dermabond was then applied.  The patient was then placed in a breast binder.  The patient tolerated the procedure well.  All the counts were correct at the end of the procedure.  The patient was then extubated in the operating room and taken in stable addition to the recovery room.          Coralie Keens   Date: 01/10/2021  Time: 10:05 AM

## 2021-01-10 NOTE — Progress Notes (Signed)
Assisted Dr. Germeroth with left, ultrasound guided, pectoralis block. Side rails up, monitors on throughout procedure. See vital signs in flow sheet. Tolerated Procedure well. 

## 2021-01-10 NOTE — Transfer of Care (Signed)
Immediate Anesthesia Transfer of Care Note  Patient: Lisa Price  Procedure(s) Performed: LEFT BREAST LUMPECTOMY WITH RADIOACTIVE SEED AND SENTINEL LYMPH NODE BIOPSY (Left Breast)  Patient Location: PACU  Anesthesia Type:General and Regional  Level of Consciousness: awake, alert  and oriented  Airway & Oxygen Therapy: Patient Spontanous Breathing and Patient connected to face mask oxygen  Post-op Assessment: Report given to RN and Post -op Vital signs reviewed and stable  Post vital signs: Reviewed and stable  Last Vitals:  Vitals Value Taken Time  BP    Temp    Pulse 77 01/10/21 1016  Resp 13 01/10/21 1016  SpO2 100 % 01/10/21 1016  Vitals shown include unvalidated device data.  Last Pain:  Vitals:   01/10/21 0706  TempSrc: Oral  PainSc: 0-No pain         Complications: No complications documented.

## 2021-01-10 NOTE — Interval H&P Note (Signed)
History and Physical Interval Note:no change in H and P  01/10/2021 7:19 AM  Lisa Price  has presented today for surgery, with the diagnosis of LEFT BREAST CANCER.  The various methods of treatment have been discussed with the patient and family. After consideration of risks, benefits and other options for treatment, the patient has consented to  Procedure(s): LEFT BREAST LUMPECTOMY WITH RADIOACTIVE SEED AND SENTINEL LYMPH NODE BIOPSY (Left) as a surgical intervention.  The patient's history has been reviewed, patient examined, no change in status, stable for surgery.  I have reviewed the patient's chart and labs.  Questions were answered to the patient's satisfaction.     Coralie Keens

## 2021-01-10 NOTE — Anesthesia Postprocedure Evaluation (Signed)
Anesthesia Post Note  Patient: Lisa Price  Procedure(s) Performed: LEFT BREAST LUMPECTOMY WITH RADIOACTIVE SEED AND SENTINEL LYMPH NODE BIOPSY (Left Breast)     Patient location during evaluation: PACU Anesthesia Type: General Level of consciousness: sedated and patient cooperative Pain management: pain level controlled Vital Signs Assessment: post-procedure vital signs reviewed and stable Respiratory status: spontaneous breathing Cardiovascular status: stable Anesthetic complications: no   No complications documented.  Last Vitals:  Vitals:   01/10/21 1115 01/10/21 1145  BP: 98/67 101/68  Pulse: (!) 58 91  Resp: 12 15  Temp:  36.6 C  SpO2: 98% 97%    Last Pain:  Vitals:   01/10/21 1145  TempSrc:   PainSc: 0-No pain                 Nolon Nations

## 2021-01-10 NOTE — Anesthesia Procedure Notes (Signed)
Anesthesia Regional Block: Pectoralis block   Pre-Anesthetic Checklist: ,, timeout performed, Correct Patient, Correct Site, Correct Laterality, Correct Procedure, Correct Position, site marked, Risks and benefits discussed,  Surgical consent,  Pre-op evaluation,  At surgeon's request and post-op pain management  Laterality: Left  Prep: chloraprep       Needles:   Needle Type: Stimiplex     Needle Length: 9cm      Additional Needles:   Procedures:,,,, ultrasound used (permanent image in chart),,,,  Narrative:  Start time: 01/10/2021 7:55 AM End time: 01/10/2021 8:00 AM Injection made incrementally with aspirations every 5 mL.  Performed by: Personally  Anesthesiologist: Nolon Nations, MD  Additional Notes: Patient tolerated well. Good fascial spread noted.

## 2021-01-10 NOTE — Discharge Instructions (Signed)
[facility] Office Phone Number [phone]  BREAST BIOPSY/ PARTIAL MASTECTOMY: POST OP INSTRUCTIONS  Always review your discharge instruction sheet given to you by the facility where your surgery was performed.  IF YOU HAVE DISABILITY OR FAMILY LEAVE FORMS, YOU MUST BRING THEM TO THE OFFICE FOR PROCESSING.  DO NOT GIVE THEM TO YOUR DOCTOR.  1. A prescription for pain medication may be given to you upon discharge.  Take your pain medication as prescribed, if needed.  If narcotic pain medicine is not needed, then you may take acetaminophen (Tylenol) or ibuprofen (Advil) as needed. 2. Take your usually prescribed medications unless otherwise directed 3. If you need a refill on your pain medication, please contact your pharmacy.  They will contact our office to request authorization.  Prescriptions will not be filled after 5pm or on week-ends. 4. You should eat very light the first 24 hours after surgery, such as soup, crackers, pudding, etc.  Resume your normal diet the day after surgery. 5. Most patients will experience some swelling and bruising in the breast.  Ice packs and a good support bra will help.  Swelling and bruising can take several days to resolve.  6. It is common to experience some constipation if taking pain medication after surgery.  Increasing fluid intake and taking a stool softener will usually help or prevent this problem from occurring.  A mild laxative (Milk of Magnesia or Miralax) should be taken according to package directions if there are no bowel movements after 48 hours. 7. Unless discharge instructions indicate otherwise, you may remove your bandages 24-48 hours after surgery, and you may shower at that time.  You may have steri-strips (small skin tapes) in place directly over the incision.  These strips should be left on the skin for 7-10 days.  If your surgeon used skin glue on the incision, you may shower in 24 hours.  The glue will flake off over the  next 2-3 weeks.  Any sutures or staples will be removed at the office during your follow-up visit. 8. ACTIVITIES:  You may resume regular daily activities (gradually increasing) beginning the next day.  Wearing a good support bra or sports bra minimizes pain and swelling.  You may have sexual intercourse when it is comfortable. a. You may drive when you no longer are taking prescription pain medication, you can comfortably wear a seatbelt, and you can safely maneuver your car and apply brakes. b. RETURN TO WORK:  ______________________________________________________________________________________ 9. You should see your doctor in the office for a follow-up appointment approximately two weeks after your surgery.  Your doctor's nurse will typically make your follow-up appointment when she calls you with your pathology report.  Expect your pathology report 2-3 business days after your surgery.  You may call to check if you do not hear from us after three days. 10. OTHER INSTRUCTIONS:OK TO REMOVE THE BINDER AND SHOWER STARTING TOMORROW 11. ICE PACK, TYLENOL, AND IBUPROFEN ALSO FOR PAIN 12. NO VIGOROUS ACTIVITY FOR ONE WEEK _______________________________________________________________________________________________ _____________________________________________________________________________________________________________________________________ _____________________________________________________________________________________________________________________________________ _____________________________________________________________________________________________________________________________________  WHEN TO CALL YOUR DOCTOR: 1. Fever over 101.0 2. Nausea and/or vomiting. 3. Extreme swelling or bruising. 4. Continued bleeding from incision. 5. Increased pain, redness, or drainage from the incision.  The clinic staff is available to answer your questions during regular business hours.   Please don't hesitate to call and ask to speak to one of the nurses for clinical concerns.  If you have a medical emergency, go to the nearest emergency room or   call 911.  A surgeon from [facility] is always on call at the hospital.  For further questions, please visit [email or web address]    Post Anesthesia Home Care Instructions  Activity: Get plenty of rest for the remainder of the day. A responsible individual must stay with you for 24 hours following the procedure.  For the next 24 hours, DO NOT: -Drive a car -Paediatric nurse -Drink alcoholic beverages -Take any medication unless instructed by your physician -Make any legal decisions or sign important papers.  Meals: Start with liquid foods such as gelatin or soup. Progress to regular foods as tolerated. Avoid greasy, spicy, heavy foods. If nausea and/or vomiting occur, drink only clear liquids until the nausea and/or vomiting subsides. Call your physician if vomiting continues.  Special Instructions/Symptoms: Your throat may feel dry or sore from the anesthesia or the breathing tube placed in your throat during surgery. If this causes discomfort, gargle with warm salt water. The discomfort should disappear within 24 hours.  If you had a scopolamine patch placed behind your ear for the management of post- operative nausea and/or vomiting:  1. The medication in the patch is effective for 72 hours, after which it should be removed.  Wrap patch in a tissue and discard in the trash. Wash hands thoroughly with soap and water. 2. You may remove the patch earlier than 72 hours if you experience unpleasant side effects which may include dry mouth, dizziness or visual disturbances. 3. Avoid touching the patch. Wash your hands with soap and water after contact with the patch.   No Tylenol until 1:20 PM.

## 2021-01-10 NOTE — Progress Notes (Signed)
Assisted with nuc med injections. Side rails up, monitors on throughout procedure. See vital signs in flow sheet. Tolerated Procedure well. 

## 2021-01-13 ENCOUNTER — Encounter (HOSPITAL_BASED_OUTPATIENT_CLINIC_OR_DEPARTMENT_OTHER): Payer: Self-pay | Admitting: Surgery

## 2021-01-13 LAB — SURGICAL PATHOLOGY

## 2021-01-16 ENCOUNTER — Telehealth: Payer: Self-pay | Admitting: *Deleted

## 2021-01-16 ENCOUNTER — Encounter: Payer: Self-pay | Admitting: *Deleted

## 2021-01-16 NOTE — Telephone Encounter (Signed)
Received order for oncotype testing. Requisition faxed to pathology and [facility] °

## 2021-01-19 NOTE — Progress Notes (Signed)
TELEPHONE VISIT  Patient Care Team: [place] Darlen Round, MD as PCP - General (Internal Medicine) Coralie Keens, MD as Consulting Physician (General Surgery) Nicholas Lose, MD as Consulting Physician (Hematology and Oncology) Eppie Gibson, MD as Attending Physician (Radiation Oncology) Mauro Kaufmann, RN as Oncology Nurse Navigator Rockwell Germany, RN as Oncology Nurse Navigator  DIAGNOSIS:    ICD-10-CM   1. Malignant neoplasm of upper-outer quadrant of left breast in female, estrogen receptor positive ([place])  C50.412    Z17.0     SUMMARY OF ONCOLOGIC HISTORY: Oncology History  Malignant neoplasm of upper-outer quadrant of left breast in female, estrogen receptor positive ([place])  12/24/2020 Initial Diagnosis   Screening mammogram showed a 0.8cm mass at the 2 o'clock position in the left breast and no left axillary adenopathy. Biopsy showed invasive and in situ carcinoma, grade 2, HER-2 equivocal by IHC (2+), ER/PR+ >95%, Ki67 3%.   12/25/2020 Cancer Staging   Staging form: Breast, AJCC 8th Edition - Clinical stage from 12/25/2020: Stage IA (cT1b, cN0, cM0, G2, ER+, PR+, HER2: Equivocal) - Signed by Nicholas Lose, MD on 12/25/2020   01/01/2021 Genetic Testing   Negative genetic testing: no pathogenic variants detected in [facility] BRCAPlus Panel.  The report date is January 01, 2021.    The BRCAplus panel offered by [employer] and includes sequencing and deletion/duplication analysis for the following 8 genes: ATM, BRCA1, BRCA2, CDH1, CHEK2, PALB2, PTEN, and TP53.    Negative genetic testing: no pathogenic variants detected in [facility] CancerNext-Expanded +RNAinsight.  The report date is January 05, 2021.    The CancerNext-Expanded gene panel offered by [facility] and includes sequencing, rearrangement, and RNA analysis for the following 77 genes: AIP, ALK, APC, ATM, AXIN2, BAP1, BARD1, BLM, BMPR1A, BRCA1, BRCA2, BRIP1, CDC73, CDH1, CDK4, CDKN1B, CDKN2A, CHEK2, CTNNA1, DICER1, FANCC,  FH, FLCN, GALNT12, KIF1B, LZTR1, MAX, MEN1, MET, MLH1, MSH2, MSH3, MSH6, MUTYH, NBN, NF1, NF2, NTHL1, PALB2, PHOX2B, PMS2, POT1, PRKAR1A, PTCH1, PTEN, RAD51C, RAD51D, RB1, RECQL, RET, SDHA, SDHAF2, SDHB, SDHC, SDHD, SMAD4, SMARCA4, SMARCB1, SMARCE1, STK11, SUFU, TMEM127, TP53, TSC1, TSC2, VHL and XRCC2 (sequencing and deletion/duplication); EGFR, EGLN1, HOXB13, KIT, MITF, PDGFRA, POLD1, and POLE (sequencing only); EPCAM and GREM1 (deletion/duplication only).    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     CHIEF COMPLIANT: Follow-up s/p lumpectomy   INTERVAL HISTORY: Lisa Price is a 65 y.o. with above-mentioned history of left breast cancer. She underwent a left lumpectomy on 01/10/21 with Dr. 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 She presents via telephone visit today to review the pathology report and discuss further treatment.   ALLERGIES:  has No Known Allergies.  MEDICATIONS:  Current Outpatient Medications  Medication Sig Dispense Refill  . clonazePAM (KLONOPIN) 1 MG tablet 1 tablet    . desvenlafaxine (PRISTIQ) 100 MG 24 hr tablet Take 100 mg by mouth at bedtime.    Marland Kitchen L-Methylfolate-Algae (DEPLIN 15) 15-90.314 MG CAPS Take 1 tablet by mouth.    . lisdexamfetamine (VYVANSE) 70 MG capsule 1 capsule every morning    . traMADol (ULTRAM) 50 MG tablet Take 1-2 tablets (50-100 mg total) by mouth every 6 (six) hours as needed for moderate pain or severe pain. 25 tablet 0  . traZODone (DESYREL) 100 MG tablet trazodone 100 mg tablet  No current facility-administered medications for this visit.    PHYSICAL EXAMINATION: ECOG PERFORMANCE STATUS: 1 - Symptomatic but completely ambulatory  There were no vitals filed for this visit. There were no vitals filed for this  visit.   LABORATORY DATA:  I have reviewed the data as listed CMP Latest Ref Rng & Units 12/25/2020  Glucose 70 - 99 mg/dL 90  BUN 8 - 23 mg/dL 21  Creatinine 0.44 - 1.00 mg/dL 0.78  Sodium 135 - 145 mmol/L 140  Potassium 3.5 - 5.1 mmol/L 4.0  Chloride 98 - 111 mmol/L 107  CO2 22 - 32 mmol/L 24  Calcium 8.9 - 10.3 mg/dL 9.4  Total Protein 6.5 - 8.1 g/dL 7.0  Total Bilirubin 0.3 - 1.2 mg/dL 0.3  Alkaline Phos 38 - 126 U/L 90  AST 15 - 41 U/L 12(L)  ALT 0 - 44 U/L 12    Lab Results  Component Value Date   WBC 9.1 12/25/2020   HGB 13.7 12/25/2020   HCT 41.7 12/25/2020   MCV 83.9 12/25/2020   PLT 352 12/25/2020   NEUTROABS 6.1 12/25/2020    ASSESSMENT & PLAN:  Malignant neoplasm of upper-outer quadrant of left breast in female, estrogen receptor positive ([place]) 01/10/21: Left lumpectomy Ninfa Linden): invasive and in situ ductal carcinoma, 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  HER-2 equivocal by IHC (2+), FISH neg ER/PR+ >95%, Ki67 3%.  Pathology counseling: I discussed the final pathology report of the patient provided  a copy of this report. I discussed the margins as well as lymph node surgeries. We also discussed the final staging along with previously performed ER/PR and HER-2/neu testing.  Plan: 1. Oncotype Dx 2. Adj XRT 3. Adj Anti estrogen therapy  RTC based on Oncotype Dx      No orders of the defined types were placed in this encounter.  The patient has a good understanding of the overall plan. she agrees with it. she will call with any problems that may develop before the next visit here.  Total time spent: 12 mins including non face to face time and time spent for planning, charting and coordination of care  Rulon Eisenmenger, MD, MPH 01/20/2021  I, Molly Dorshimer, am acting as scribe for Dr. Nicholas Lose.  I have reviewed the above documentation for accuracy and completeness, and I agree with the above.

## 2021-01-19 NOTE — Assessment & Plan Note (Signed)
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  HER-2 equivocal by IHC (2+), FISH neg ER/PR+ >95%, Ki67 3%.  Pathology counseling: I discussed the final pathology report of the patient provided  a copy of this report. I discussed the margins as well as lymph node surgeries. We also discussed the final staging along with previously performed ER/PR and HER-2/neu testing.  Plan: 1. Oncotype Dx 2. Adj XRT 3. Adj Anti estrogen therapy  RTC based on Oncotype Dx

## 2021-01-20 ENCOUNTER — Inpatient Hospital Stay: Payer: BC Managed Care – PPO | Attending: Hematology and Oncology | Admitting: Hematology and Oncology

## 2021-01-20 DIAGNOSIS — Z170 Estrogen receptor positive status [ER+]: Secondary | ICD-10-CM | POA: Diagnosis not present

## 2021-01-20 DIAGNOSIS — C50412 Malignant neoplasm of upper-outer quadrant of left female breast: Secondary | ICD-10-CM | POA: Diagnosis not present

## 2021-01-28 ENCOUNTER — Telehealth: Payer: Self-pay | Admitting: Radiation Oncology

## 2021-01-28 ENCOUNTER — Telehealth: Payer: Self-pay | Admitting: *Deleted

## 2021-01-28 ENCOUNTER — Encounter: Payer: Self-pay | Admitting: *Deleted

## 2021-01-28 ENCOUNTER — Encounter (HOSPITAL_COMMUNITY): Payer: Self-pay

## 2021-01-28 DIAGNOSIS — C50412 Malignant neoplasm of upper-outer quadrant of left female breast: Secondary | ICD-10-CM

## 2021-01-28 DIAGNOSIS — Z170 Estrogen receptor positive status [ER+]: Secondary | ICD-10-CM

## 2021-01-28 NOTE — Telephone Encounter (Signed)
Received oncotype results of 7/3%.  Patient is aware.  Referral placed for Dr. Sondra Come and team notified.

## 2021-01-28 NOTE — Telephone Encounter (Signed)
Rec'd referral from Dr. Lindi Adie for Dr. Sondra Come. Tried reaching patient to schedule her. Phone just rings. Will try again.

## 2021-01-31 ENCOUNTER — Encounter: Payer: Self-pay | Admitting: Hematology and Oncology

## 2021-02-03 ENCOUNTER — Encounter: Payer: Self-pay | Admitting: *Deleted

## 2021-02-04 ENCOUNTER — Encounter: Payer: Self-pay | Admitting: Radiation Oncology

## 2021-02-04 NOTE — Progress Notes (Signed)
Location of Breast Cancer: Left Breast  Histology per Pathology Report:  12/19/2020     01/10/2021  Receptor Status: ER(>95%), PR (>95%), Her2-neu (-), Ki-67(3%)  Did patient present with symptoms (if so, please note symptoms) or was this found on screening mammography?: Screening mammogram   Past/Anticipated interventions by surgeon, if any: 01/10/2021 Procedure: LEFT BREAST LUMPECTOMY WITH RADIOACTIVE SEED AND SENTINEL LYMPH NODE BIOPSY;  Surgeon: Coralie Keens, MD;  Location: [place];  Service: General;  Laterality: Left;  Past/Anticipated interventions by medical oncology, if any:  Per Dr Lindi Adie Plan: 1. Oncotype Dx 2. Adj XRT 3. Adj Anti estrogen therapy  RTC based on Oncotype Dx  Lymphedema issues, if any:  no  Pain issues, if any: Patient reports pain but it is associated only to rash on her back/chest/arms  SAFETY ISSUES:  Prior radiation? no  Pacemaker/ICD? no  Possible current pregnancy? No hysterectomy  Is the patient on methotrexate? no  Current Complaints / other details:  Patient is accompanied by her husband Lisa Price.    Vitals:   02/10/21 0836  BP: 122/87  Pulse: 70  Resp: 18  Temp: (!) 96.7 F (35.9 C)  SpO2: 97%  Weight: 200 lb 3.2 oz (90.8 kg)  Height: '5\' 6"'  (1.676 m)

## 2021-02-06 ENCOUNTER — Other Ambulatory Visit: Payer: Self-pay

## 2021-02-06 ENCOUNTER — Ambulatory Visit: Payer: BC Managed Care – PPO | Attending: Surgery | Admitting: Physical Therapy

## 2021-02-06 ENCOUNTER — Encounter: Payer: Self-pay | Admitting: Physical Therapy

## 2021-02-06 DIAGNOSIS — C50412 Malignant neoplasm of upper-outer quadrant of left female breast: Secondary | ICD-10-CM | POA: Insufficient documentation

## 2021-02-06 DIAGNOSIS — R293 Abnormal posture: Secondary | ICD-10-CM | POA: Diagnosis present

## 2021-02-06 DIAGNOSIS — Z483 Aftercare following surgery for neoplasm: Secondary | ICD-10-CM | POA: Insufficient documentation

## 2021-02-06 DIAGNOSIS — Z170 Estrogen receptor positive status [ER+]: Secondary | ICD-10-CM | POA: Diagnosis present

## 2021-02-06 NOTE — Patient Instructions (Signed)
            [facility]         [address], [place] [address]         [phone]         Annia Friendly, PT, CLT   After Breast Cancer Class It is recommended you attend the ABC class to be educated on lymphedema risk reduction. This class is free of charge and lasts for 1 hour. It is a 1-time class.  You are scheduled for March 21st at 11:00. We will send you a link through your e-mail either the night before or the morning of 3/21. You need to download a Webex app.  Scar massage You can use coconut oil (buy this at the grocery store in the baking aisle). Gently massage the scars a few minutes each day.   Home exercise Program You need to begin a walking program, working up to at least 30 minutes each day. Start with 10 minutes, 3 times a day if needed. Walk fast enough to increase your heart but slow enough to talk to your husband.   Follow up PT: It is recommended you return every 3 months for the first 3 years following surgery to be assessed on the SOZO machine for an L-Dex score. This helps prevent clinically significant lymphedema in 95% of patients. These follow up screens are 15 minute appointments that you are not billed for. You are scheduled for May 9th at 2:00.

## 2021-02-06 NOTE — Therapy (Signed)
[place], [place], [address] Phone: [phone]   Fax:  [phone]  Physical Therapy Treatment  Patient Details  Name: Lisa Price MRN: [id number] Date of Birth: [birth year] Referring Provider (PT): Dr. Coralie Keens   Encounter Date: 02/06/2021   PT End of Session - 02/06/21 1146    Visit Number 2    Number of Visits 2    PT Start Time 0768    PT Stop Time 1142    PT Time Calculation (min) 50 min    Activity Tolerance Patient tolerated treatment well    Behavior During Therapy [facility] for tasks assessed/performed           Past Medical History:  Diagnosis Date  . Anxiety   . Depression   . Family history of breast cancer 12/25/2020  . Family history of thyroid cancer 12/25/2020  . Hiatal hernia   . Sleep apnea     Past Surgical History:  Procedure Laterality Date  . ABDOMINAL HYSTERECTOMY    . BREAST LUMPECTOMY WITH RADIOACTIVE SEED AND SENTINEL LYMPH NODE BIOPSY Left 01/10/2021   Procedure: LEFT BREAST LUMPECTOMY WITH RADIOACTIVE SEED AND SENTINEL LYMPH NODE BIOPSY;  Surgeon: Coralie Keens, MD;  Location: [place];  Service: General;  Laterality: Left;    There were no vitals filed for this visit.   Subjective Assessment - 02/06/21 1057    Subjective Patient underwent left lumpectomy and sentinel node biopsy (2 negative nodes) on 01/10/2021. Her Oncotype score was very low so no chemo needed. She is scheduled for radiation beginning 02/10/2021 followed by anti-estrogen therapy. She has had hives for a week.    Pertinent History Patient was diagnosed on 11/20/2021 with left invasive ductal carcinoma breast cancer.Patient underwent left lumpectomy and sentinel node biopsy (2 negative nodes) on 01/10/2021. It is ER/PR positive and HER2 is pending. Her Ki67 is 3%. She has chronic stomach pain which interferes with daily activities and is also limited by depression and anxiety.     Patient Stated Goals Make sure my arm is doing ok    Currently in Pain? Yes    Pain Score 2     Pain Location Breast    Pain Orientation Left    Pain Descriptors / Indicators Tender    Pain Onset 1 to 4 weeks ago    Pain Frequency Intermittent    Aggravating Factors  Nothing    Pain Relieving Factors Nothing              [facility] PT Assessment - 02/06/21 0001      Assessment   Medical Diagnosis s/p left lumpectomy and SLNB    Referring Provider (PT) Dr. Coralie Keens    Onset Date/Surgical Date 01/10/21    Hand Dominance Right    Prior Therapy Baselines      Precautions   Precautions Other (comment)    Precaution Comments recent surgery      Restrictions   Weight Bearing Restrictions No      Balance Screen   Has the patient fallen in the past 6 months No    Has the patient had a decrease in activity level because of a fear of falling?  No    Is the patient reluctant to leave their home because of a fear of falling?  No      Home Environment   Living Environment Private residence    Living Arrangements Spouse/significant other    Available Help at  Discharge Family      Prior Function   Level of Independence Independent    Vocation Unemployed    Leisure She is not exercising      Cognition   Overall Cognitive Status Within Functional Limits for tasks assessed      Observation/Other Assessments   Observations Incisions both appear to be healing well. She has significant hives present on her chest, back, inner thighs, and buttocks which she describes as very itchy. Photos taken (see media tab).      Posture/Postural Control   Posture/Postural Control Postural limitations    Postural Limitations Rounded Shoulders;Forward head      ROM / Strength   AROM / PROM / Strength AROM      AROM   AROM Assessment Site Shoulder    Right/Left Shoulder Left    Left Shoulder Extension 50 Degrees    Left Shoulder Flexion 158 Degrees    Left Shoulder ABduction 168 Degrees     Left Shoulder Internal Rotation 68 Degrees    Left Shoulder External Rotation 90 Degrees      Strength   Overall Strength Within functional limits for tasks performed             LYMPHEDEMA/ONCOLOGY QUESTIONNAIRE - 02/06/21 0001      Type   Cancer Type Left breast cancer      Surgeries   Lumpectomy Date 01/10/21    Sentinel Lymph Node Biopsy Date 01/10/21    Number Lymph Nodes Removed 2      Treatment   Active Chemotherapy Treatment No    Past Chemotherapy Treatment No    Active Radiation Treatment No    Past Radiation Treatment No    Current Hormone Treatment No    Past Hormone Therapy No      What other symptoms do you have   Are you Having Heaviness or Tightness Yes    Are you having Pain Yes    Are you having pitting edema No    Is it Hard or Difficult finding clothes that fit No    Do you have infections No    Is there Decreased scar mobility No    Stemmer Sign No      Lymphedema Assessments   Lymphedema Assessments Upper extremities      Right Upper Extremity Lymphedema   10 cm Proximal to Olecranon Process 29.8 cm    Olecranon Process 27 cm    10 cm Proximal to Ulnar Styloid Process 23.5 cm    Just Proximal to Ulnar Styloid Process 17 cm    Across Hand at [employer] 20 cm    At [place] of 2nd Digit 6.3 cm      Left Upper Extremity Lymphedema   10 cm Proximal to Olecranon Process 30 cm    Olecranon Process 25.5 cm    10 cm Proximal to Ulnar Styloid Process 22 cm    Just Proximal to Ulnar Styloid Process 16.7 cm    Across Hand at [employer] 20 cm    At [place] of 2nd Digit 6.3 cm              Quick Dash - 02/06/21 0001    Open a tight or new jar No difficulty    Do heavy household chores (wash walls, wash floors) No difficulty    Carry a shopping bag or briefcase No difficulty    Wash your back No difficulty    Use a knife to cut  food No difficulty    Recreational activities in which you take some force or impact through your arm,  shoulder, or hand (golf, hammering, tennis) No difficulty    During the past week, to what extent has your arm, shoulder or hand problem interfered with your normal social activities with family, friends, neighbors, or groups? Not at all    During the past week, to what extent has your arm, shoulder or hand problem limited your work or other regular daily activities Not at all    Arm, shoulder, or hand pain. None    Tingling (pins and needles) in your arm, shoulder, or hand None    Difficulty Sleeping No difficulty    DASH Score 0 %                          PT Education - 02/06/21 1146    Education Details Lymphedema risk reduction, importance of walking program, scar massage techique for incisions    Person(s) Educated Patient    Methods Explanation;Demonstration;Handout    Comprehension Returned demonstration;Verbalized understanding               PT Long Term Goals - 02/06/21 1204      PT LONG TERM GOAL #1   Title Patient will demonstrate she has regained full shoulder ROM and function post operatively compared to baselines.    Time 8    Period Weeks    Status Achieved                 Plan - 02/06/21 1159    Clinical Impression Statement Patient is doing well recovering from a left lumpectomy and sentinel node biopsy (2 negative nodes) on 01/10/2021. She has regained shoulder ROM and function, shows no sign of lymphedema, and her incisions are healing well. She appeared very anxious at the beginning of her visit questioning why she needed to be here and wanting to leave. PT explained the purpose of her visit but gave her the option to leave. She agreed to stay and by the end of the session was reporting that she was grateful she stayed. Her primary complaint is that she has hives on her chest, back, inner thighs and buttocks for no known reason (photos taken; under media tab). She has reported this to several physicians (non involved in her cancer care) but  has received no help. PT sent message to radiation oncology and her nurse navigators to see if they had suggestions as it may impact her ability to  begin radiation next week. She plans to participate in the After Breast Cancer class on 02/24/2021 but otherwise has no PT needs at this time    PT Treatment/Interventions ADLs/Self Care Home Management;Therapeutic exercise;Patient/family education    PT Next Visit Plan D/C    PT Home Exercise Plan Walking program    Consulted and Agree with Plan of Care Patient           Patient will benefit from skilled therapeutic intervention in order to improve the following deficits and impairments:  Postural dysfunction,Decreased range of motion,Decreased knowledge of precautions,Impaired UE functional use,Pain  Visit Diagnosis: Malignant neoplasm of upper-outer quadrant of left breast in female, estrogen receptor positive ([place])  Abnormal posture  Aftercare following surgery for neoplasm     Problem List Patient Active Problem List   Diagnosis Date Noted  . Genetic testing 01/03/2021  . Family history of breast cancer 12/25/2020  . Family history of  thyroid cancer 12/25/2020  . Malignant neoplasm of upper-outer quadrant of left breast in female, estrogen receptor positive ([place]) 12/24/2020    PHYSICAL THERAPY DISCHARGE SUMMARY  Visits from Start of Care: 2 Current functional level related to goals / functional outcomes: See above for measurements.   Remaining deficits: None  Education / Equipment: Walking program and lymphedema education Plan: Patient agrees to discharge.  Patient goals were met. Patient is being discharged due to meeting the stated rehab goals.  ?????        Annia Friendly, [place] 02/06/21 12:04 PM  [place], [place], [address] Phone: [phone]   Fax:  [phone]  Name: Lisa Price MRN: [id number] Date of Birth:  1956-02-09

## 2021-02-07 ENCOUNTER — Telehealth: Payer: Self-pay | Admitting: *Deleted

## 2021-02-07 NOTE — Telephone Encounter (Signed)
Left message for a return phone call concerning hives/rash.  The pictures in Epic look more like yeast.  Sent a message to Dr. Ninfa Linden and reviewed them and agreed. Per his recommendation patient to use over the counter anti-fungal creams.

## 2021-02-09 NOTE — Progress Notes (Signed)
Radiation Oncology         [phone] ________________________________  Name: Lisa Price MRN: [id number]  Date: 02/10/2021  DOB: [birth year]  Re-Evaluation Note  CC: Lauro Franklin, MD  Nicholas Lose, MD    ICD-10-CM   1. Malignant neoplasm of upper-outer quadrant of left breast in female, estrogen receptor positive ([place])  C50.412    Z17.0     Diagnosis: Stage pT1c, pN0 Left Breast UOQ, Invasive Ductal Carcinoma with high-grade DCIS, ER+ / PR+ / Her2-, Grade 2  Narrative:  The patient returns today to discuss radiation treatment options. She was seen in the multidisciplinary breast clinic on 12/25/2020, at which time it was recommended that she proceed with genetic testing, lumpectomy with sentinel node procedure, oncotype depending on final tumor size, adjuvant radiation therapy, and aromatase inhibitor.  Since consultation, she underwent genetic testing on 12/25/2020. Results were negative.  She underwent a left breast lumpectomy with deep left axillary sentinel lymph node biopsy on 01/10/2021 under the care of Dr. Ninfa Linden. Pathology from the procedure revealed grade 2 invasive ductal carcinoma with high-nuclear grade DCIS with central necrosis and calcifications. Invasive carcinoma broadly involved the posterior margin and was focally < 1 mm from the lateral margin. DCIS was broadly < 1 mm from each the posterior, inferior, and lateral margins. Additional posterior-lateral margin was excised and showed extensive residual DCIS, which was focally < 1 mm from the final posterior-lateral margin. Two left axillary sentinel lymph nodes were biopsied, both of which were negative for carcinoma.  Oncotype DX was obtained on the final surgical sample and the recurrence score of 7 predicted a risk of recurrence outside the breast over the next 9 years of 3%, if the patient's only systemic therapy was an antiestrogen for 5 years. It also predicted no significant benefit from chemotherapy.  On  review of systems, the patient reports no significant pain in the left breast nipple discharge or bleeding. She denies problems with swelling in her left arm or hand.  She is quite bothered by a diffuse rash involving much of her upper body.  She denies any breathing issues.  She did speak with her primary care physician and apparently went to their ER or urgent care for this issue.  Plans are for her to see her dermatologist soon as possible for this issue.   Allergies:  has No Known Allergies.  Meds: Current Outpatient Medications  Medication Sig Dispense Refill   clonazePAM (KLONOPIN) 1 MG tablet 1 tablet     desvenlafaxine (PRISTIQ) 100 MG 24 hr tablet Take 100 mg by mouth at bedtime.     L-Methylfolate-Algae (DEPLIN 15) 15-90.314 MG CAPS Take 1 tablet by mouth.     lisdexamfetamine (VYVANSE) 70 MG capsule 1 capsule every morning     traZODone (DESYREL) 100 MG tablet trazodone 100 mg tablet     No current facility-administered medications for this encounter.    Physical Findings: The patient is in no acute distress. Patient is alert and oriented.  height is '5\' 6"'  (1.676 m) and weight is 200 lb 3.2 oz (90.8 kg). Her temperature is 96.7 F (35.9 C) (abnormal). Her blood pressure is 122/87 and her pulse is 70. Her respiration is 18 and oxygen saturation is 97%.   Lungs are clear to auscultation bilaterally. Heart has regular rate and rhythm. No palpable cervical, supraclavicular, or axillary adenopathy. Abdomen soft, non-tender, normal bowel sounds. Right breast: no palpable mass, nipple discharge or bleeding. Left breast: Scars well-healed.  No  signs of drainage or infection within the left breast.  The patient has good range of movement of her left arm and shoulder.  She has a diffuse rash involving her back chest and arm areas.  Pruritus is so bad that she has caused some of these areas to bleed.  No signs of infection in the these areas.     Lab Findings: Lab Results  Component  Value Date   WBC 9.1 12/25/2020   HGB 13.7 12/25/2020   HCT 41.7 12/25/2020   MCV 83.9 12/25/2020   PLT 352 12/25/2020    Radiographic Findings: No results found.  Impression:  Stage pT1c, pN0 Left Breast UOQ, Invasive Ductal Carcinoma with high-grade DCIS, ER+ / PR+ / Her2-, Grade 2  She would be a good candidate for adjuvant radiation therapy directed to the left breast.  I discussed the overall treatment course side effects and potential toxicities of radiation therapy.  She appears to understand and wished to proceed with planned course of treatment.  Consent form signed for radiation therapy.  Plan:  Patient is scheduled for CT simulation later today.  She would appear to be a good candidate for hypofractionated accelerated radiation therapy over approximately 4 weeks.  Will use cardiac sparing techniques if necessary.  Would recommend a boost to the lumpectomy cavity given the close DCIS margin.  Total time spent in this encounter was 35 minutes which included reviewing the patient's most recent genetic testing, lumpectomy, pathology report, Oncotype DX, physical examination, and documentation.  -----------------------------------  Blair Promise, PhD, MD  This document serves as a record of services personally performed by Gery Pray, MD. It was created on his behalf by Clerance Lav, a trained medical scribe. The creation of this record is based on the scribe's personal observations and the provider's statements to them. This document has been checked and approved by the attending provider.

## 2021-02-10 ENCOUNTER — Ambulatory Visit
Admission: RE | Admit: 2021-02-10 | Discharge: 2021-02-10 | Disposition: A | Discharge status: Home / Self Care | Payer: BC Managed Care – PPO | Source: Ambulatory Visit | Attending: Radiation Oncology | Admitting: Radiation Oncology

## 2021-02-10 ENCOUNTER — Encounter: Payer: Self-pay | Admitting: Radiation Oncology

## 2021-02-10 ENCOUNTER — Other Ambulatory Visit: Payer: Self-pay

## 2021-02-10 VITALS — BP 122/87 | HR 70 | Temp 96.7°F | Resp 18 | Ht 66.0 in | Wt 200.1 lb

## 2021-02-10 DIAGNOSIS — Z510 Encounter for antineoplastic radiation therapy: Secondary | ICD-10-CM | POA: Insufficient documentation

## 2021-02-10 DIAGNOSIS — Z79899 Other long term (current) drug therapy: Secondary | ICD-10-CM | POA: Diagnosis not present

## 2021-02-10 DIAGNOSIS — Z923 Personal history of irradiation: Secondary | ICD-10-CM | POA: Diagnosis not present

## 2021-02-10 DIAGNOSIS — Z170 Estrogen receptor positive status [ER+]: Secondary | ICD-10-CM | POA: Diagnosis not present

## 2021-02-10 DIAGNOSIS — C50412 Malignant neoplasm of upper-outer quadrant of left female breast: Secondary | ICD-10-CM

## 2021-02-10 DIAGNOSIS — R21 Rash and other nonspecific skin eruption: Secondary | ICD-10-CM | POA: Diagnosis not present

## 2021-02-10 NOTE — Progress Notes (Signed)
See md note for nursing evaluation 

## 2021-02-13 DIAGNOSIS — Z510 Encounter for antineoplastic radiation therapy: Secondary | ICD-10-CM | POA: Diagnosis not present

## 2021-02-17 ENCOUNTER — Encounter: Payer: Self-pay | Admitting: *Deleted

## 2021-02-17 ENCOUNTER — Telehealth: Payer: Self-pay | Admitting: Hematology and Oncology

## 2021-02-17 NOTE — Telephone Encounter (Signed)
Scheduled appointment per 3/14 sch msg. Pt aware.

## 2021-02-18 ENCOUNTER — Telehealth: Payer: Self-pay | Admitting: *Deleted

## 2021-02-18 NOTE — Telephone Encounter (Signed)
Patient called and left message.  She states she got results from Dr. Ubaldo Glassing (dermatologist) that the skin cultured a staph infection.  Dr. Ubaldo Glassing ordered antibiotic prior to knowing the results of the culture.  She was originally presrcibed a 1 week course.  She is to follow up with dermatology in 2 weeks but that was prior to the culture results.  She stated she plans to call dermatologist to make sure that she was prescribed most appropriate antibiotic and that 1 week of dosing is enough.  She is unsure if she should keep her start date with radiation set for tomorrow 02/19/2021.  Routed to Dr Sondra Come to advise.

## 2021-02-19 ENCOUNTER — Ambulatory Visit: Payer: BC Managed Care – PPO | Admitting: Radiation Oncology

## 2021-02-20 ENCOUNTER — Ambulatory Visit: Payer: BC Managed Care – PPO

## 2021-02-21 ENCOUNTER — Ambulatory Visit: Payer: BC Managed Care – PPO

## 2021-02-24 ENCOUNTER — Other Ambulatory Visit: Payer: Self-pay

## 2021-02-24 ENCOUNTER — Ambulatory Visit
Admission: RE | Admit: 2021-02-24 | Discharge: 2021-02-24 | Disposition: A | Discharge status: Home / Self Care | Payer: BC Managed Care – PPO | Source: Ambulatory Visit | Attending: Radiation Oncology | Admitting: Radiation Oncology

## 2021-02-24 DIAGNOSIS — Z510 Encounter for antineoplastic radiation therapy: Secondary | ICD-10-CM | POA: Diagnosis not present

## 2021-02-25 ENCOUNTER — Ambulatory Visit: Payer: BC Managed Care – PPO

## 2021-02-26 ENCOUNTER — Ambulatory Visit
Admission: RE | Admit: 2021-02-26 | Discharge: 2021-02-26 | Disposition: A | Discharge status: Home / Self Care | Payer: BC Managed Care – PPO | Source: Ambulatory Visit | Attending: Radiation Oncology | Admitting: Radiation Oncology

## 2021-02-26 ENCOUNTER — Other Ambulatory Visit: Payer: Self-pay

## 2021-02-26 DIAGNOSIS — Z510 Encounter for antineoplastic radiation therapy: Secondary | ICD-10-CM | POA: Diagnosis not present

## 2021-02-26 DIAGNOSIS — Z170 Estrogen receptor positive status [ER+]: Secondary | ICD-10-CM

## 2021-02-26 DIAGNOSIS — C50412 Malignant neoplasm of upper-outer quadrant of left female breast: Secondary | ICD-10-CM

## 2021-02-26 MED ORDER — RADIAPLEXRX EX GEL: Freq: Once | CUTANEOUS | Status: AC

## 2021-02-26 MED ORDER — ALRA NON-METALLIC DEODORANT (RAD-ONC)
1.0000 "application " | Freq: Once | TOPICAL | Status: AC
  Administered 2021-02-26: 1 via TOPICAL

## 2021-02-27 ENCOUNTER — Ambulatory Visit
Admission: RE | Admit: 2021-02-27 | Discharge: 2021-02-27 | Disposition: A | Discharge status: Home / Self Care | Payer: BC Managed Care – PPO | Source: Ambulatory Visit | Attending: Radiation Oncology | Admitting: Radiation Oncology

## 2021-02-27 ENCOUNTER — Telehealth: Payer: Self-pay | Admitting: *Deleted

## 2021-02-27 DIAGNOSIS — Z510 Encounter for antineoplastic radiation therapy: Secondary | ICD-10-CM | POA: Diagnosis not present

## 2021-02-27 NOTE — Telephone Encounter (Signed)
Patient called complaining of left elbow being painful yesterday and now it feels like it is burning.  She states that the skin texture is changing and feels rough and crinkly.    Returned call to patient to discuss further, had to leave a voice mail.  We are treating patient with radiation to breast, unsure how this is relatable to her treatment.  Need to further investigate.

## 2021-02-28 ENCOUNTER — Ambulatory Visit
Admission: RE | Admit: 2021-02-28 | Discharge: 2021-02-28 | Disposition: A | Discharge status: Home / Self Care | Payer: BC Managed Care – PPO | Source: Ambulatory Visit | Attending: Radiation Oncology | Admitting: Radiation Oncology

## 2021-02-28 ENCOUNTER — Other Ambulatory Visit: Payer: Self-pay

## 2021-02-28 DIAGNOSIS — Z510 Encounter for antineoplastic radiation therapy: Secondary | ICD-10-CM | POA: Diagnosis not present

## 2021-03-03 ENCOUNTER — Ambulatory Visit
Admission: RE | Admit: 2021-03-03 | Discharge: 2021-03-03 | Disposition: A | Discharge status: Home / Self Care | Payer: BC Managed Care – PPO | Source: Ambulatory Visit | Attending: Radiation Oncology | Admitting: Radiation Oncology

## 2021-03-03 ENCOUNTER — Other Ambulatory Visit: Payer: Self-pay

## 2021-03-03 DIAGNOSIS — Z510 Encounter for antineoplastic radiation therapy: Secondary | ICD-10-CM | POA: Diagnosis not present

## 2021-03-04 ENCOUNTER — Ambulatory Visit: Payer: BC Managed Care – PPO | Admitting: Radiation Oncology

## 2021-03-04 ENCOUNTER — Ambulatory Visit
Admission: RE | Admit: 2021-03-04 | Discharge: 2021-03-04 | Disposition: A | Discharge status: Home / Self Care | Payer: BC Managed Care – PPO | Source: Ambulatory Visit | Attending: Radiation Oncology | Admitting: Radiation Oncology

## 2021-03-04 DIAGNOSIS — Z510 Encounter for antineoplastic radiation therapy: Secondary | ICD-10-CM | POA: Diagnosis not present

## 2021-03-05 ENCOUNTER — Other Ambulatory Visit: Payer: Self-pay

## 2021-03-05 ENCOUNTER — Ambulatory Visit
Admission: RE | Admit: 2021-03-05 | Discharge: 2021-03-05 | Disposition: A | Discharge status: Home / Self Care | Payer: BC Managed Care – PPO | Source: Ambulatory Visit | Attending: Radiation Oncology | Admitting: Radiation Oncology

## 2021-03-05 DIAGNOSIS — Z510 Encounter for antineoplastic radiation therapy: Secondary | ICD-10-CM | POA: Diagnosis not present

## 2021-03-06 ENCOUNTER — Ambulatory Visit
Admission: RE | Admit: 2021-03-06 | Discharge: 2021-03-06 | Disposition: A | Discharge status: Home / Self Care | Payer: BC Managed Care – PPO | Source: Ambulatory Visit | Attending: Radiation Oncology | Admitting: Radiation Oncology

## 2021-03-06 DIAGNOSIS — Z510 Encounter for antineoplastic radiation therapy: Secondary | ICD-10-CM | POA: Diagnosis not present

## 2021-03-07 ENCOUNTER — Ambulatory Visit
Admission: RE | Admit: 2021-03-07 | Discharge: 2021-03-07 | Disposition: A | Discharge status: Home / Self Care | Payer: BC Managed Care – PPO | Source: Ambulatory Visit | Attending: Radiation Oncology | Admitting: Radiation Oncology

## 2021-03-07 ENCOUNTER — Other Ambulatory Visit: Payer: Self-pay

## 2021-03-07 DIAGNOSIS — Z170 Estrogen receptor positive status [ER+]: Secondary | ICD-10-CM | POA: Insufficient documentation

## 2021-03-07 DIAGNOSIS — C50412 Malignant neoplasm of upper-outer quadrant of left female breast: Secondary | ICD-10-CM | POA: Insufficient documentation

## 2021-03-07 DIAGNOSIS — R300 Dysuria: Secondary | ICD-10-CM | POA: Insufficient documentation

## 2021-03-09 NOTE — Assessment & Plan Note (Signed)
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  HER-2 equivocal by IHC (2+), FISH neg ER/PR+ >95%, Ki67 3%.  Pathology counseling: I discussed the final pathology report of the patient provided  a copy of this report. I discussed the margins as well as lymph node surgeries. We also discussed the final staging along with previously performed ER/PR and HER-2/neu testing.  Plan: 1. Oncotype Dx 7 (3% ROR) 2. Adj XRT 02/26/21-  3. Adj Anti estrogen therapy  Anastrozole counseling: We discussed the risks and benefits of anti-estrogen therapy with aromatase inhibitors. These include but not limited to insomnia, hot flashes, mood changes, vaginal dryness, bone density loss, and weight gain. We strongly believe that the benefits far outweigh the risks. Patient understands these risks and consented to starting treatment. Planned treatment duration is 5-7 years.  RTC 3 months

## 2021-03-09 NOTE — Progress Notes (Signed)
Patient Care Team: Lauro Franklin, MD as PCP - General (Internal Medicine) Coralie Keens, MD as Consulting Physician (General Surgery) Nicholas Lose, MD as Consulting Physician (Hematology and Oncology) Mauro Kaufmann, RN as Oncology Nurse Navigator Rockwell Germany, RN as Oncology Nurse Navigator Gery Pray, MD as Consulting Physician (Radiation Oncology)  DIAGNOSIS:    ICD-10-CM   1. Malignant neoplasm of upper-outer quadrant of left breast in female, estrogen receptor positive ([place])  C50.412    Z17.0     SUMMARY OF ONCOLOGIC HISTORY: Oncology History  Malignant neoplasm of upper-outer quadrant of left breast in female, estrogen receptor positive ([place])  12/24/2020 Initial Diagnosis   Screening mammogram showed a 0.8cm mass at the 2 o'clock position in the left breast and no left axillary adenopathy. Biopsy showed invasive and in situ carcinoma, grade 2, HER-2 equivocal by IHC (2+), ER/PR+ >95%, Ki67 3%.   12/25/2020 Cancer Staging   Staging form: Breast, AJCC 8th Edition - Clinical stage from 12/25/2020: Stage IA (cT1b, cN0, cM0, G2, ER+, PR+, HER2: Equivocal) - Signed by Nicholas Lose, MD on 12/25/2020   01/01/2021 Genetic Testing   Negative genetic testing: no pathogenic variants detected in [facility] BRCAPlus Panel.  The report date is January 01, 2021.    The BRCAplus panel offered by [employer] and includes sequencing and deletion/duplication analysis for the following 8 genes: ATM, BRCA1, BRCA2, CDH1, CHEK2, PALB2, PTEN, and TP53.    Negative genetic testing: no pathogenic variants detected in [facility] CancerNext-Expanded +RNAinsight.  The report date is January 05, 2021.    The CancerNext-Expanded gene panel offered by [facility] and includes sequencing, rearrangement, and RNA analysis for the following 77 genes: AIP, ALK, APC, ATM, AXIN2, BAP1, BARD1, BLM, BMPR1A, BRCA1, BRCA2, BRIP1, CDC73, CDH1, CDK4, CDKN1B, CDKN2A, CHEK2, CTNNA1, DICER1, FANCC, FH, FLCN, GALNT12,  KIF1B, LZTR1, MAX, MEN1, MET, MLH1, MSH2, MSH3, MSH6, MUTYH, NBN, NF1, NF2, NTHL1, PALB2, PHOX2B, PMS2, POT1, PRKAR1A, PTCH1, PTEN, RAD51C, RAD51D, RB1, RECQL, RET, SDHA, SDHAF2, SDHB, SDHC, SDHD, SMAD4, SMARCA4, SMARCB1, SMARCE1, STK11, SUFU, TMEM127, TP53, TSC1, TSC2, VHL and XRCC2 (sequencing and deletion/duplication); EGFR, EGLN1, HOXB13, KIT, MITF, PDGFRA, POLD1, and POLE (sequencing only); EPCAM and GREM1 (deletion/duplication only).    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   02/26/2021 -  Radiation Therapy   Adjuvant radiation     CHIEF COMPLIANT: Follow-up to discuss antiestrogen therapy  INTERVAL HISTORY: ALEANA FIFITA is a 65 y.o. with above-mentioned history of left breast cancer who underwent a left lumpectomy and is currently on radiation. She presents to the clinic today to discuss antiestrogen therapy.  She is tolerating radiation extremely well without any problems or concerns.  ALLERGIES:  has No Known Allergies.  MEDICATIONS:  Current Outpatient Medications  Medication Sig Dispense Refill  . [START ON 04/06/2021] anastrozole (ARIMIDEX) 1 MG tablet Take 1 tablet (1 mg total) by mouth daily. 90 tablet 3  . clonazePAM (KLONOPIN) 1 MG tablet 1 tablet    . desvenlafaxine (PRISTIQ) 100 MG 24 hr tablet Take 100 mg by mouth at bedtime.    Marland Kitchen L-Methylfolate-Algae (DEPLIN 15) 15-90.314 MG CAPS Take 1 tablet by mouth.    . lisdexamfetamine (VYVANSE) 70 MG capsule 1 capsule every morning    . traZODone (DESYREL) 100 MG tablet trazodone 100 mg tablet     No current facility-administered medications for this visit.    PHYSICAL EXAMINATION:  ECOG PERFORMANCE STATUS: 1 - Symptomatic but completely ambulatory  Vitals:   03/10/21 1126  BP: 122/82  Pulse: 91  Resp: 18  Temp: (!) 97.5 F (36.4 C)  SpO2: 97%   Filed Weights   03/10/21 1126  Weight: 195 lb 14.4 oz (88.9  kg)    LABORATORY DATA:  I have reviewed the data as listed CMP Latest Ref Rng & Units 12/25/2020  Glucose 70 - 99 mg/dL 90  BUN 8 - 23 mg/dL 21  Creatinine 0.44 - 1.00 mg/dL 0.78  Sodium 135 - 145 mmol/L 140  Potassium 3.5 - 5.1 mmol/L 4.0  Chloride 98 - 111 mmol/L 107  CO2 22 - 32 mmol/L 24  Calcium 8.9 - 10.3 mg/dL 9.4  Total Protein 6.5 - 8.1 g/dL 7.0  Total Bilirubin 0.3 - 1.2 mg/dL 0.3  Alkaline Phos 38 - 126 U/L 90  AST 15 - 41 U/L 12(L)  ALT 0 - 44 U/L 12    Lab Results  Component Value Date   WBC 9.1 12/25/2020   HGB 13.7 12/25/2020   HCT 41.7 12/25/2020   MCV 83.9 12/25/2020   PLT 352 12/25/2020   NEUTROABS 6.1 12/25/2020    ASSESSMENT & PLAN:  Malignant neoplasm of upper-outer quadrant of left breast in female, estrogen receptor positive ([place]) 01/10/21: Left lumpectomy Ninfa Linden): invasive and in situ ductal carcinoma, 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  HER-2 equivocal by IHC (2+), FISH neg ER/PR+ >95%, Ki67 3%.  Plan: 1. Oncotype Dx 7 (3% ROR) 2. Adj XRT 02/26/21- 03/25/21 3. Adj Anti estrogen therapy  Anastrozole counseling: We discussed the risks and benefits of anti-estrogen therapy with aromatase inhibitors. These include but not limited to insomnia, hot flashes, mood changes, vaginal dryness, bone density loss, and weight gain. We strongly believe that the benefits far outweigh the risks. Patient understands these risks and consented to starting treatment. Planned treatment duration is 5-7 years.  RTC 3 months    No orders of the defined types were placed in this encounter.  The patient has a good understanding of the overall plan. she agrees with it. she will call with any problems that may develop before the next visit here.  Total time spent: 20 mins including face to face time and time spent for planning, charting and coordination of care  Rulon Eisenmenger, MD, MPH 03/10/2021  I, Cloyde Reams Dorshimer, am acting as scribe  for Dr. Nicholas Lose.  I have reviewed the above documentation for accuracy and completeness, and I agree with the above.

## 2021-03-10 ENCOUNTER — Other Ambulatory Visit: Payer: Self-pay

## 2021-03-10 ENCOUNTER — Inpatient Hospital Stay: Payer: BC Managed Care – PPO | Attending: Hematology and Oncology | Admitting: Hematology and Oncology

## 2021-03-10 ENCOUNTER — Telehealth: Payer: Self-pay | Admitting: Hematology and Oncology

## 2021-03-10 ENCOUNTER — Ambulatory Visit
Admission: RE | Admit: 2021-03-10 | Discharge: 2021-03-10 | Disposition: A | Discharge status: Home / Self Care | Payer: BC Managed Care – PPO | Source: Ambulatory Visit | Attending: Radiation Oncology | Admitting: Radiation Oncology

## 2021-03-10 DIAGNOSIS — Z79811 Long term (current) use of aromatase inhibitors: Secondary | ICD-10-CM | POA: Insufficient documentation

## 2021-03-10 DIAGNOSIS — R300 Dysuria: Secondary | ICD-10-CM | POA: Diagnosis not present

## 2021-03-10 DIAGNOSIS — C50412 Malignant neoplasm of upper-outer quadrant of left female breast: Secondary | ICD-10-CM

## 2021-03-10 DIAGNOSIS — Z170 Estrogen receptor positive status [ER+]: Secondary | ICD-10-CM

## 2021-03-10 MED ORDER — ANASTROZOLE 1 MG PO TABS: 1.0000 mg | ORAL_TABLET | Freq: Every day | ORAL | 3 refills | Status: DC

## 2021-03-10 NOTE — Telephone Encounter (Signed)
Scheduled appt per 4/4 los. Pt aware.

## 2021-03-11 ENCOUNTER — Ambulatory Visit: Payer: BC Managed Care – PPO | Admitting: Radiation Oncology

## 2021-03-11 ENCOUNTER — Ambulatory Visit
Admission: RE | Admit: 2021-03-11 | Discharge: 2021-03-11 | Disposition: A | Discharge status: Home / Self Care | Payer: BC Managed Care – PPO | Source: Ambulatory Visit | Attending: Radiation Oncology | Admitting: Radiation Oncology

## 2021-03-11 DIAGNOSIS — R300 Dysuria: Secondary | ICD-10-CM | POA: Diagnosis not present

## 2021-03-12 ENCOUNTER — Ambulatory Visit
Admission: RE | Admit: 2021-03-12 | Discharge: 2021-03-12 | Disposition: A | Discharge status: Home / Self Care | Payer: BC Managed Care – PPO | Source: Ambulatory Visit | Attending: Radiation Oncology | Admitting: Radiation Oncology

## 2021-03-12 ENCOUNTER — Other Ambulatory Visit: Payer: Self-pay

## 2021-03-12 ENCOUNTER — Other Ambulatory Visit: Payer: Self-pay | Admitting: Radiology

## 2021-03-12 ENCOUNTER — Ambulatory Visit: Payer: BC Managed Care – PPO | Admitting: Radiation Oncology

## 2021-03-12 DIAGNOSIS — R300 Dysuria: Secondary | ICD-10-CM | POA: Diagnosis not present

## 2021-03-12 LAB — URINALYSIS, COMPLETE (UACMP) WITH MICROSCOPIC
Bacteria, UA: NONE SEEN
Bilirubin Urine: NEGATIVE
Glucose, UA: NEGATIVE mg/dL
Hgb urine dipstick: NEGATIVE
Ketones, ur: NEGATIVE mg/dL
Leukocytes,Ua: NEGATIVE
Nitrite: NEGATIVE
Protein, ur: NEGATIVE mg/dL
Specific Gravity, Urine: 1.018 (ref 1.005–1.030)
pH: 8 (ref 5.0–8.0)

## 2021-03-13 ENCOUNTER — Other Ambulatory Visit: Payer: Self-pay

## 2021-03-13 ENCOUNTER — Ambulatory Visit
Admission: RE | Admit: 2021-03-13 | Discharge: 2021-03-13 | Disposition: A | Discharge status: Home / Self Care | Payer: BC Managed Care – PPO | Source: Ambulatory Visit | Attending: Radiation Oncology | Admitting: Radiation Oncology

## 2021-03-13 DIAGNOSIS — R300 Dysuria: Secondary | ICD-10-CM | POA: Diagnosis not present

## 2021-03-13 LAB — URINE CULTURE: Culture: NO GROWTH

## 2021-03-14 ENCOUNTER — Ambulatory Visit
Admission: RE | Admit: 2021-03-14 | Discharge: 2021-03-14 | Disposition: A | Discharge status: Home / Self Care | Payer: BC Managed Care – PPO | Source: Ambulatory Visit | Attending: Radiation Oncology | Admitting: Radiation Oncology

## 2021-03-14 DIAGNOSIS — R300 Dysuria: Secondary | ICD-10-CM | POA: Diagnosis not present

## 2021-03-17 ENCOUNTER — Other Ambulatory Visit: Payer: Self-pay

## 2021-03-17 ENCOUNTER — Ambulatory Visit
Admission: RE | Admit: 2021-03-17 | Discharge: 2021-03-17 | Disposition: A | Discharge status: Home / Self Care | Payer: BC Managed Care – PPO | Source: Ambulatory Visit | Attending: Radiation Oncology | Admitting: Radiation Oncology

## 2021-03-17 DIAGNOSIS — R300 Dysuria: Secondary | ICD-10-CM | POA: Diagnosis not present

## 2021-03-18 ENCOUNTER — Encounter: Payer: Self-pay | Admitting: *Deleted

## 2021-03-18 ENCOUNTER — Ambulatory Visit: Payer: BC Managed Care – PPO

## 2021-03-18 ENCOUNTER — Ambulatory Visit
Admission: RE | Admit: 2021-03-18 | Discharge: 2021-03-18 | Disposition: A | Discharge status: Home / Self Care | Payer: BC Managed Care – PPO | Source: Ambulatory Visit | Attending: Radiation Oncology | Admitting: Radiation Oncology

## 2021-03-18 DIAGNOSIS — C50412 Malignant neoplasm of upper-outer quadrant of left female breast: Secondary | ICD-10-CM

## 2021-03-18 DIAGNOSIS — R300 Dysuria: Secondary | ICD-10-CM | POA: Diagnosis not present

## 2021-03-18 DIAGNOSIS — Z170 Estrogen receptor positive status [ER+]: Secondary | ICD-10-CM

## 2021-03-19 ENCOUNTER — Ambulatory Visit
Admission: RE | Admit: 2021-03-19 | Discharge: 2021-03-19 | Disposition: A | Discharge status: Home / Self Care | Payer: BC Managed Care – PPO | Source: Ambulatory Visit | Attending: Radiation Oncology | Admitting: Radiation Oncology

## 2021-03-19 DIAGNOSIS — R300 Dysuria: Secondary | ICD-10-CM | POA: Diagnosis not present

## 2021-03-20 ENCOUNTER — Other Ambulatory Visit: Payer: Self-pay

## 2021-03-20 ENCOUNTER — Ambulatory Visit
Admission: RE | Admit: 2021-03-20 | Discharge: 2021-03-20 | Disposition: A | Discharge status: Home / Self Care | Payer: BC Managed Care – PPO | Source: Ambulatory Visit | Attending: Radiation Oncology | Admitting: Radiation Oncology

## 2021-03-20 DIAGNOSIS — R300 Dysuria: Secondary | ICD-10-CM | POA: Diagnosis not present

## 2021-03-21 ENCOUNTER — Ambulatory Visit
Admission: RE | Admit: 2021-03-21 | Discharge: 2021-03-21 | Disposition: A | Discharge status: Home / Self Care | Payer: BC Managed Care – PPO | Source: Ambulatory Visit | Attending: Radiation Oncology | Admitting: Radiation Oncology

## 2021-03-21 DIAGNOSIS — C50412 Malignant neoplasm of upper-outer quadrant of left female breast: Secondary | ICD-10-CM

## 2021-03-21 DIAGNOSIS — R300 Dysuria: Secondary | ICD-10-CM | POA: Diagnosis not present

## 2021-03-21 MED ORDER — RADIAPLEXRX EX GEL: Freq: Once | CUTANEOUS | Status: AC

## 2021-03-24 ENCOUNTER — Encounter: Payer: Self-pay | Admitting: *Deleted

## 2021-03-24 ENCOUNTER — Ambulatory Visit
Admission: RE | Admit: 2021-03-24 | Discharge: 2021-03-24 | Disposition: A | Discharge status: Home / Self Care | Payer: BC Managed Care – PPO | Source: Ambulatory Visit | Attending: Radiation Oncology | Admitting: Radiation Oncology

## 2021-03-24 ENCOUNTER — Ambulatory Visit: Payer: BC Managed Care – PPO

## 2021-03-24 ENCOUNTER — Other Ambulatory Visit: Payer: Self-pay

## 2021-03-24 DIAGNOSIS — R300 Dysuria: Secondary | ICD-10-CM | POA: Diagnosis not present

## 2021-03-25 ENCOUNTER — Ambulatory Visit
Admission: RE | Admit: 2021-03-25 | Discharge: 2021-03-25 | Disposition: A | Discharge status: Home / Self Care | Payer: BC Managed Care – PPO | Source: Ambulatory Visit | Attending: Radiation Oncology | Admitting: Radiation Oncology

## 2021-03-25 DIAGNOSIS — R300 Dysuria: Secondary | ICD-10-CM | POA: Diagnosis not present

## 2021-04-14 ENCOUNTER — Ambulatory Visit: Payer: BC Managed Care – PPO | Attending: Surgery

## 2021-04-14 ENCOUNTER — Other Ambulatory Visit: Payer: Self-pay

## 2021-04-14 DIAGNOSIS — Z483 Aftercare following surgery for neoplasm: Secondary | ICD-10-CM | POA: Insufficient documentation

## 2021-04-14 NOTE — Therapy (Signed)
[place], [place], [address] Phone: [phone]   Fax:  [phone]  Physical Therapy Treatment  Patient Details  Name: Lisa Price MRN: [id number] Date of Birth: 1956-04-25 Referring Provider (PT): Dr. Coralie Keens   Encounter Date: 04/14/2021   PT End of Session - 04/14/21 1419    Visit Number 2   # unchanged due to screen only   Number of Visits 2    PT Start Time 1402    PT Stop Time 1417    PT Time Calculation (min) 15 min    Activity Tolerance Patient tolerated treatment well    Behavior During Therapy [facility] for tasks assessed/performed           Past Medical History:  Diagnosis Date  . Anxiety   . Depression   . Family history of breast cancer 12/25/2020  . Family history of thyroid cancer 12/25/2020  . Hiatal hernia   . Sleep apnea     Past Surgical History:  Procedure Laterality Date  . ABDOMINAL HYSTERECTOMY    . BREAST LUMPECTOMY WITH RADIOACTIVE SEED AND SENTINEL LYMPH NODE BIOPSY Left 01/10/2021   Procedure: LEFT BREAST LUMPECTOMY WITH RADIOACTIVE SEED AND SENTINEL LYMPH NODE BIOPSY;  Surgeon: Coralie Keens, MD;  Location: [place];  Service: General;  Laterality: Left;    There were no vitals filed for this visit.   Subjective Assessment - 04/14/21 1404    Subjective Pt returns for her 3 month L-Dex screen.    Pertinent History Patient was diagnosed on 11/20/2021 with left invasive ductal carcinoma breast cancer.Patient underwent left lumpectomy and sentinel node biopsy (2 negative nodes) on 01/10/2021. It is ER/PR positive and HER2 is pending. Her Ki67 is 3%. She has chronic stomach pain which interferes with daily activities and is also limited by depression and anxiety.                  L-DEX FLOWSHEETS - 04/14/21 1400      L-DEX LYMPHEDEMA SCREENING   Measurement Type Unilateral    L-DEX MEASUREMENT EXTREMITY Upper Extremity    POSITION   Standing    DOMINANT SIDE Right    At Risk Side Left    BASELINE SCORE (UNILATERAL) -2.6    L-DEX SCORE (UNILATERAL) 2.4    VALUE CHANGE (UNILAT) 5                                  PT Long Term Goals - 02/06/21 1204      PT LONG TERM GOAL #1   Title Patient will demonstrate she has regained full shoulder ROM and function post operatively compared to baselines.    Time 8    Period Weeks    Status Achieved                 Plan - 04/14/21 1421    Clinical Impression Statement Pt returns for her 3 month L-Dex screen. Her chnage from baseline of 5 is WNLs so no further treatment is required at this time except to cont every 3 month L-Dex screens which pt is agreeable to.    PT Next Visit Plan Cont every 3 month L-Dex screens for up to 2 years from her SLNB.    Consulted and Agree with Plan of Care Patient           Patient will benefit from skilled therapeutic  intervention in order to improve the following deficits and impairments:     Visit Diagnosis: Aftercare following surgery for neoplasm     Problem List Patient Active Problem List   Diagnosis Date Noted  . Genetic testing 01/03/2021  . Family history of breast cancer 12/25/2020  . Family history of thyroid cancer 12/25/2020  . Malignant neoplasm of upper-outer quadrant of left breast in female, estrogen receptor positive ([place]) 12/24/2020    [name], PTA 04/14/2021, 2:22 PM  [place], [place], [address] Phone: [phone]   Fax:  [phone]  Name: Lisa Price MRN: [id number] Date of Birth: [birth year]

## 2021-04-21 ENCOUNTER — Encounter: Payer: Self-pay | Admitting: Radiation Oncology

## 2021-04-25 NOTE — Progress Notes (Signed)
Radiation Oncology         [phone] ________________________________  Name: Lisa Price MRN: [id number]  Date: 04/28/2021  DOB: Jul 18, 1956  Follow-Up Visit Note  CC: Lauro Franklin, MD  Nicholas Lose, MD  Diagnosis:   StagepT1c, pN0LeftBreast UOQ,Invasive DuctalCarcinoma with high-grade DCIS, ER+/ PR+/ Her2-, Grade2  Interval Since Last Radiation:  One month, four days   Radiation treatment dates:  02/24/2021 - 03/25/2021  Dose/Rate:  Breast_Lt_BH: 15 of 15 fractions, 40.05 of 40.05 (Gy) Brst_L_Bst_BH: 6 of 6 fractions, 12 of 12(Gy)  Narrative:   The patient returns today for routine follow-up regarding left breast invasive ductal carcinoma.  Since last visit, patient followed-up with Dr. Lindi Adie on 03/10/2021 to discuss antiestrogen therapy. Patient agreed and consented to starting antiestrogen therapy (anastrozole).  It was noted that the patient tolerated radiation very well and had no complaints, however since completion of radiation therapy she complains of significant fatigue and rates her energy level as 40 to 50% of baseline.  She has some sensitivity of the breast but no significant pain.  She has had some intermittent abdominal pain and is seeking out a primary care physician to evaluate this issue.  She does see a primary care physician at [facility] it is not becoming too far for her to travel.                         Allergies:  has No Known Allergies.  Meds: Current Outpatient Medications  Medication Sig Dispense Refill  . clonazePAM (KLONOPIN) 1 MG tablet 1 tablet    . desvenlafaxine (PRISTIQ) 100 MG 24 hr tablet Take 100 mg by mouth at bedtime.    Marland Kitchen L-Methylfolate-Algae (DEPLIN 15) 15-90.314 MG CAPS Take 1 tablet by mouth.    . lisdexamfetamine (VYVANSE) 70 MG capsule 1 capsule every morning    . traZODone (DESYREL) 100 MG tablet trazodone 100 mg tablet    . anastrozole (ARIMIDEX) 1 MG tablet Take 1 tablet (1 mg total) by mouth daily. (Patient not taking:  Reported on 04/28/2021) 90 tablet 3   No current facility-administered medications for this encounter.    Physical Findings: The patient is in no acute distress. Patient is alert and oriented.  height is '5\' 6"'  (1.676 m) and weight is 196 lb 6 oz (89.1 kg). Her temporal temperature is 96.8 F (36 C) (abnormal). Her blood pressure is 132/82 and her pulse is 93. Her respiration is 20 and oxygen saturation is 98%. .   Lungs are clear to auscultation bilaterally. Heart has regular rate and rhythm. No palpable cervical, supraclavicular, or axillary adenopathy. Abdomen soft, non-tender, normal bowel sounds.  Examination of the right breast reveals no palpable mass nipple discharge or bleeding.  Examination left breast reveals mild hyperpigmentation changes.  The patient's skin is healed well.  She has induration underneath her lumpectomy cavity but no dominant mass.  No nipple discharge or bleeding.  Lab Findings: Lab Results  Component Value Date   WBC 9.1 12/25/2020   HGB 13.7 12/25/2020   HCT 41.7 12/25/2020   MCV 83.9 12/25/2020   PLT 352 12/25/2020    Radiographic Findings: No results found.  Impression:   StagepT1c, pN0LeftBreast UOQ,Invasive DuctalCarcinoma with high-grade DCIS, ER+/ PR+/ Her2-, Grade2  Patient skin is healed well.  No palpable or visible signs of recurrence  The patient is recovering from the effects of radiation.  Fatigue still continues to be an issue for her but should improve over  the next several weeks.  Plan: As needed follow-up in radiation oncology.  Patient will continue close follow-up with medical oncology and will start adjuvant hormonal therapy in the near future.    ____________________________________  Blair Promise, PhD, MD   This document serves as a record of services personally performed by Gery Pray, MD. It was created on his behalf by Roney Mans, a trained medical scribe. The creation of this record is based on the scribe's  personal observations and the provider's statements to them. This document has been checked and approved by the attending provider.

## 2021-04-28 ENCOUNTER — Ambulatory Visit
Admission: RE | Admit: 2021-04-28 | Discharge: 2021-04-28 | Disposition: A | Discharge status: Home / Self Care | Payer: BC Managed Care – PPO | Source: Ambulatory Visit | Attending: Radiation Oncology | Admitting: Radiation Oncology

## 2021-04-28 ENCOUNTER — Other Ambulatory Visit: Payer: Self-pay

## 2021-04-28 ENCOUNTER — Encounter: Payer: Self-pay | Admitting: Radiation Oncology

## 2021-04-28 VITALS — BP 132/82 | HR 93 | Temp 96.8°F | Resp 20 | Ht 66.0 in | Wt 196.4 lb

## 2021-04-28 DIAGNOSIS — R5383 Other fatigue: Secondary | ICD-10-CM | POA: Diagnosis not present

## 2021-04-28 DIAGNOSIS — C50412 Malignant neoplasm of upper-outer quadrant of left female breast: Secondary | ICD-10-CM | POA: Diagnosis present

## 2021-04-28 DIAGNOSIS — Z923 Personal history of irradiation: Secondary | ICD-10-CM | POA: Diagnosis not present

## 2021-04-28 DIAGNOSIS — Z170 Estrogen receptor positive status [ER+]: Secondary | ICD-10-CM | POA: Diagnosis not present

## 2021-04-28 DIAGNOSIS — Z79899 Other long term (current) drug therapy: Secondary | ICD-10-CM | POA: Insufficient documentation

## 2021-04-28 HISTORY — DX: Personal history of irradiation: Z92.3

## 2021-04-28 NOTE — Progress Notes (Signed)
Lisa Price is here today for follow up post radiation to the breast.   Breast Side:left   They completed their radiation on: 03/25/2021   Does the patient complain of any of the following: . Post radiation skin issues: mild pinkness remains to left breast. Denies itching, peeling or burning of treatment area. . Breast Tenderness: Occasional sharp, shooting pain to left breast . Breast Swelling: denies . Lymphadema: denies . Range of Motion limitations: ROM has returned to baseline . Fatigue post radiation: reports "extreme fatigue" . Appetite good/fair/poor: fair . Sleep - fair. Uses CPAP and medication for sleep  Additional comments if applicable: Abdominal pain. Would like recommendations for a local PCP.  Vitals:   04/28/21 1553  BP: 132/82  Pulse: 93  Resp: 20  Temp: (!) 96.8 F (36 C)  TempSrc: Temporal  SpO2: 98%  Weight: 196 lb 6 oz (89.1 kg)  Height: 5\' 6"  (1.676 m)

## 2021-05-07 NOTE — Progress Notes (Incomplete)
Patient Name: Lisa Price MRN: [id number] DOB: Oct 15, 1956 Referring Physician: Nicholas Lose (Profile Not Attached) Date of Service: 03/25/2021 [facility] [place]                    End Of Treatment Note  Diagnoses: C50.412-Malignant neoplasm of upper-outer quadrant of left female breast  Cancer Staging: StagepT1c, pN0LeftBreast  Intent: Curative  Radiation Treatment Dates: 02/24/2021 through 03/25/2021 Site Technique Total Dose (Gy) Dose per Fx (Gy) Completed Fx Beam Energies  Breast, Left: Breast_Lt 3D 40.05/40.05 2.67 15/15 6X, 10X  Breast, Left: Breast_Lt_Bst 3D 12/12 2 6/6 6X, 10X   Narrative: The patient tolerated radiation therapy relatively well and presents today after final boost treatment. She reported a slight increase in fatigue, but states that it is manageable. She denied any chest pain, shortness of breath, or issues with range of motion to her left arm/shoulder and denied any noticeable swelling to breast or axilla, or any itching/irritation to treatment field. She does report having occasional tenderness to breast but states that it resolves on its own. On assessment, her skin is noted to be intact with redness to the breast and hyperpigmentation to axilla. She confirms using Radiaplex as directed  Plan: The patient will follow-up with radiation oncology in one month .  ________________________________________________ -----------------------------------  Blair Promise, PhD, MD  This document serves as a record of services personally performed by Gery Pray, MD. It was created on his behalf by Roney Mans, a trained medical scribe. The creation of this record is based on the scribe's personal observations and the provider's statements to them. This document has been checked and approved by the attending provider.

## 2021-06-11 NOTE — Progress Notes (Signed)
SURVIVORSHIP VISIT:    BRIEF ONCOLOGIC HISTORY:  Oncology History  Malignant neoplasm of upper-outer quadrant of left breast in female, estrogen receptor positive ([place])  12/24/2020 Initial Diagnosis   Screening mammogram showed a 0.8cm mass at the 2 o'clock position in the left breast and no left axillary adenopathy. Biopsy showed invasive and in situ carcinoma, grade 2, HER-2 equivocal by IHC (2+), ER/PR+ >95%, Ki67 3%.   12/25/2020 Cancer Staging   Staging form: Breast, AJCC 8th Edition - Clinical stage from 12/25/2020: Stage IA (cT1b, cN0, cM0, G2, ER+, PR+, HER2: Equivocal) - Signed by Nicholas Lose, MD on 12/25/2020    01/01/2021 Genetic Testing   Negative genetic testing: no pathogenic variants detected in [facility] BRCAPlus Panel.  The report date is January 01, 2021.    The BRCAplus panel offered by [employer] and includes sequencing and deletion/duplication analysis for the following 8 genes: ATM, BRCA1, BRCA2, CDH1, CHEK2, PALB2, PTEN, and TP53.    Negative genetic testing: no pathogenic variants detected in [facility] CancerNext-Expanded +RNAinsight.  The report date is January 05, 2021.    The CancerNext-Expanded gene panel offered by [facility] and includes sequencing, rearrangement, and RNA analysis for the following 77 genes: AIP, ALK, APC, ATM, AXIN2, BAP1, BARD1, BLM, BMPR1A, BRCA1, BRCA2, BRIP1, CDC73, CDH1, CDK4, CDKN1B, CDKN2A, CHEK2, CTNNA1, DICER1, FANCC, FH, FLCN, GALNT12, KIF1B, LZTR1, MAX, MEN1, MET, MLH1, MSH2, MSH3, MSH6, MUTYH, NBN, NF1, NF2, NTHL1, PALB2, PHOX2B, PMS2, POT1, PRKAR1A, PTCH1, PTEN, RAD51C, RAD51D, RB1, RECQL, RET, SDHA, SDHAF2, SDHB, SDHC, SDHD, SMAD4, SMARCA4, SMARCB1, SMARCE1, STK11, SUFU, TMEM127, TP53, TSC1, TSC2, VHL and XRCC2 (sequencing and deletion/duplication); EGFR, EGLN1, HOXB13, KIT, MITF, PDGFRA, POLD1, and POLE (sequencing only); EPCAM and GREM1 (deletion/duplication only).    01/10/2021 Surgery   Left lumpectomy Ninfa Linden): invasive and in  situ ductal carcinoma, 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   01/10/2021 Cancer Staging   Staging form: Breast, AJCC 8th Edition - Pathologic stage from 01/10/2021: Stage IA (pT1c, pN0, cM0, G2, ER+, PR+, HER2-) - Signed by Gardenia Phlegm, NP on 06/10/2021  Stage prefix: Initial diagnosis  Histologic grading system: 3 grade system    01/10/2021 Oncotype testing   7/3%   02/24/2021 - 03/25/2021 Radiation Therapy   Adjuvant radiation Site Technique Total Dose (Gy) Dose per Fx (Gy) Completed Fx Beam Energies  Breast, Left: Breast_Lt 3D 40.05/40.05 2.67 15/15 6X, 10X  Breast, Left: Breast_Lt_Bst 3D 12/12 2 6/6 6X, 10X    04/06/2021 -  Anti-estrogen oral therapy   Anastrozole daily     INTERVAL HISTORY:  Ms. Friedl to review her survivorship care plan detailing her treatment course for breast cancer, as well as monitoring long-term side effects of that treatment, education regarding health maintenance, screening, and overall wellness and health promotion.     She notes that she is feeling poorly due to some health issues with her stomach.  She has been seeing [facility] and has not been able to find any answers with why she is nauseated and fluctuates between constipation and diarrhea.  She notes that this has impacted her overall quality of life and in turn, has also impacted her mental health.  She is fatigued, she is taking naps daily.  She has not yet started the Anastrozole because she is concerned about the overall side effects of the medication and the possibility of it making her feel worse.  She notes some urinary symptoms as well today.    REVIEW OF SYSTEMS:  Review of Systems  Constitutional:  Positive for fatigue. Negative for appetite change, chills, fever and unexpected weight change.  HENT:   Negative for hearing loss, lump/mass and trouble swallowing.   Eyes:  Negative for eye problems and icterus.  Respiratory:  Negative for chest tightness,  cough and shortness of breath.   Cardiovascular:  Negative for chest pain, leg swelling and palpitations.  Gastrointestinal:  Positive for constipation, diarrhea and nausea. Negative for abdominal distention, abdominal pain and vomiting.  Endocrine: Negative for hot flashes.  Genitourinary:  Positive for difficulty urinating.   Musculoskeletal:  Negative for arthralgias.  Skin:  Negative for itching and rash.  Neurological:  Negative for dizziness, extremity weakness, headaches and numbness.  Hematological:  Negative for adenopathy. Does not bruise/bleed easily.  Psychiatric/Behavioral:  Positive for decreased concentration, depression and sleep disturbance. Negative for suicidal ideas. The patient is nervous/anxious.   Breast: Denies any new nodularity, masses, tenderness, nipple changes, or nipple discharge.      ONCOLOGY TREATMENT TEAM:  1. Surgeon:  Dr. Ninfa Linden at [facility] 2. Medical Oncologist: Dr. Lindi Adie  3. Radiation Oncologist: Dr. Sondra Come    PAST MEDICAL/SURGICAL HISTORY:  Past Medical History:  Diagnosis Date   Anxiety    Depression    Family history of breast cancer 12/25/2020   Family history of thyroid cancer 12/25/2020   Hiatal hernia    History of radiation therapy 02/24/21-03/25/21   Left breast- Dr. Gery Pray   Sleep apnea    Past Surgical History:  Procedure Laterality Date   ABDOMINAL HYSTERECTOMY     BREAST LUMPECTOMY WITH RADIOACTIVE SEED AND SENTINEL LYMPH NODE BIOPSY Left 01/10/2021   Procedure: LEFT BREAST LUMPECTOMY WITH RADIOACTIVE SEED AND SENTINEL LYMPH NODE BIOPSY;  Surgeon: Coralie Keens, MD;  Location: [place];  Service: General;  Laterality: Left;     ALLERGIES:  No Known Allergies   CURRENT MEDICATIONS:  Outpatient Encounter Medications as of 06/12/2021  Medication Sig   anastrozole (ARIMIDEX) 1 MG tablet Take 1 tablet (1 mg total) by mouth daily.   clonazePAM (KLONOPIN) 1 MG tablet 1 tablet    desvenlafaxine (PRISTIQ) 100 MG 24 hr tablet Take 100 mg by mouth at bedtime.   L-Methylfolate-Algae (DEPLIN 15) 15-90.314 MG CAPS Take 1 tablet by mouth.   lisdexamfetamine (VYVANSE) 70 MG capsule 1 capsule every morning   traZODone (DESYREL) 100 MG tablet trazodone 100 mg tablet   No facility-administered encounter medications on file as of 06/12/2021.     ONCOLOGIC FAMILY HISTORY:  Family History  Problem Relation Age of Onset   Melanoma Mother    Thyroid cancer Father        unknown age diagnosed   Breast cancer Paternal Grandmother        dx before age 17   Thyroid cancer Maternal Grandmother        dx after age 45     GENETIC COUNSELING/TESTING: See above  SOCIAL HISTORY:  Social History   Socioeconomic History   Marital status: Married    Spouse name: Not on file   Number of children: Not on file   Years of education: Not on file   Highest education level: Not on file  Occupational History   Not on file  Tobacco Use   Smoking status: Never   Smokeless tobacco: Never  Substance and Sexual Activity   Alcohol use: No   Drug use: No   Sexual activity: Not on file  Other Topics Concern  Not on file  Social History Narrative   Not on file   Social Determinants of Health   Financial Resource Strain: Not on file  Food Insecurity: No Food Insecurity   Worried About Running Out of Food in the Last Year: Never true   Ran Out of Food in the Last Year: Never true  Transportation Needs: No Transportation Needs   Lack of Transportation (Medical): No   Lack of Transportation (Non-Medical): No  Physical Activity: Not on file  Stress: Not on file  Social Connections: Not on file  Intimate Partner Violence: Not on file     OBSERVATIONS/OBJECTIVE:  BP 117/69 (BP Location: Left Arm, Patient Position: Sitting)   Pulse 87   Temp 98.1 F (36.7 C) (Tympanic)   Resp 18   Ht '5\' 6"'  (1.676 m)   Wt 198 lb 14.4 oz (90.2 kg)   SpO2 97%   BMI 32.10 kg/m  GENERAL:  Patient is a well appearing female in no acute distress HEENT:  Sclerae anicteric.  Oropharynx clear and moist. No ulcerations or evidence of oropharyngeal candidiasis. Neck is supple.  NODES:  No cervical, supraclavicular, or axillary lymphadenopathy palpated.  BREAST EXAM:  left breast s/p lumpectomy and radiation, no sign of local recurrence, right breast benign LUNGS:  Clear to auscultation bilaterally.  No wheezes or rhonchi. HEART:  Regular rate and rhythm. No murmur appreciated. ABDOMEN:  Soft, nontender.  Positive, normoactive bowel sounds. No organomegaly palpated. MSK:  No focal spinal tenderness to palpation. Full range of motion bilaterally in the upper extremities. EXTREMITIES:  No peripheral edema.   SKIN:  Clear with no obvious rashes or skin changes. No nail dyscrasia. NEURO:  Nonfocal. Well oriented.  Appropriate affect.   LABORATORY DATA:  None for this visit.  DIAGNOSTIC IMAGING:  None for this visit.      ASSESSMENT AND PLAN:  Ms.. Alberts is a pleasant 65 y.o. female with Stage IA left breast invasive ductal carcinoma, ER+/PR+/HER2-, diagnosed in 12/2020, treated with lumpectomy, adjuvant radiation therapy, and anti-estrogen therapy with Anastrozole beginning in 04/2021.  She presents to the Survivorship Clinic for our initial meeting and routine follow-up post-completion of treatment for breast cancer.    1. Stage IA left breast cancer:  Ms. Bartelson is continuing to recover from definitive treatment for breast cancer. She will follow-up with her medical oncologist, Dr. Lindi Adie in 6 months with history and physical exam per surveillance protocol.  She and I reviewed her risk of recurrence if she decides not to take the Anastrozole.  I also shared with her that we will not know if she is going to have side effects until she tries it.  She let me know that she will start taking it tomorrow.  I asked her to call us if she decides to stop taking it due to adverse effects. Her  mammogram is due 11/2021; orders placed today. Today, a comprehensive survivorship care plan and treatment summary was reviewed with the patient today detailing her breast cancer diagnosis, treatment course, potential late/long-term effects of treatment, appropriate follow-up care with recommendations for the future, and patient education resources.  A copy of this summary, along with a letter will be sent to the patient's primary care provider via mail/fax/In Basket message after today's visit.    2. Anxiety/depression: She declined to complete the QOL-CSV with my nurse Mickel Baas.  Her mental well-being is related to her decreased quality of life from her GI issues.  She is taking pristiq daily.  3. GI concerns: After our discussion, I placed a referral for her to see Dr. Laverta Baltimore at [facility] at her request.    4. Bone health:  Given Ms. Stanback's age/history of breast cancer and her current treatment regimen including anti-estrogen therapy with Anastrozole, she is at risk for bone demineralization.  I placed an order for bone density testing to be completed at [facility] today.  She was given education on specific activities to promote bone health.  5. Cancer screening:  Due to Ms. Krasowski's history and her age, she should receive screening for skin cancers, colon cancer.  The information and recommendations are listed on the patient's comprehensive care plan/treatment summary and were reviewed in detail with the patient.    6. Health maintenance and wellness promotion: Ms. Ellithorpe was encouraged to consume 5-7 servings of fruits and vegetables per day. She was also encouraged to engage in moderate to vigorous exercise for 30 minutes per day most days of the week. We discussed the [employer] fitness program, which is designed for cancer survivors to help them become more physically fit after cancer treatments.  She was instructed to limit her alcohol consumption and continue to abstain from tobacco use.      7. Support services/counseling: It is not uncommon for this period of the patient's cancer care trajectory to be one of many emotions and stressors.  She was given information regarding our available services and encouraged to contact me with any questions or for help enrolling in any of our support group/programs.   8. Urinary changes: we will get a urinalysis today to further evaluate.     Follow up instructions:    -Return to cancer center in 6 months for f/u with Dr. Lindi Adie  -Mammogram due in 11/2021 -Bone density testing -Follow up with surgery in one year -She is welcome to return back to the Survivorship Clinic at any time; no additional follow-up needed at this time.  -Consider referral back to survivorship as a long-term survivor for continued surveillance  The patient was provided an opportunity to ask questions and all were answered. The patient agreed with the plan and demonstrated an understanding of the instructions.   *Total encounter time: 50 minutes in SCP preparation, chart review, order entry, face to face visit time, care coordination, and documentation of the encounter.   Wilber Bihari, NP 06/12/21 10:35 AM Medical Oncology and Hematology [facility] [place], [place] [address] Tel. [phone]    Fax. [phone]  *Total Encounter Time as defined by the Centers for Medicare and Medicaid Services includes, in addition to the face-to-face time of a patient visit (documented in the note above) non-face-to-face time: obtaining and reviewing outside history, ordering and reviewing medications, tests or procedures, care coordination (communications with other health care professionals or caregivers) and documentation in the medical record.

## 2021-06-12 ENCOUNTER — Telehealth: Payer: Self-pay

## 2021-06-12 ENCOUNTER — Other Ambulatory Visit: Payer: Self-pay

## 2021-06-12 ENCOUNTER — Inpatient Hospital Stay: Payer: BC Managed Care – PPO | Attending: Hematology and Oncology | Admitting: Adult Health

## 2021-06-12 ENCOUNTER — Encounter: Payer: Self-pay | Admitting: Adult Health

## 2021-06-12 ENCOUNTER — Inpatient Hospital Stay: Payer: BC Managed Care – PPO

## 2021-06-12 VITALS — BP 117/69 | HR 87 | Temp 98.1°F | Resp 18 | Ht 66.0 in | Wt 198.9 lb

## 2021-06-12 DIAGNOSIS — C50412 Malignant neoplasm of upper-outer quadrant of left female breast: Secondary | ICD-10-CM | POA: Insufficient documentation

## 2021-06-12 DIAGNOSIS — Z170 Estrogen receptor positive status [ER+]: Secondary | ICD-10-CM | POA: Diagnosis not present

## 2021-06-12 DIAGNOSIS — R350 Frequency of micturition: Secondary | ICD-10-CM

## 2021-06-12 DIAGNOSIS — Z803 Family history of malignant neoplasm of breast: Secondary | ICD-10-CM | POA: Insufficient documentation

## 2021-06-12 DIAGNOSIS — F419 Anxiety disorder, unspecified: Secondary | ICD-10-CM | POA: Diagnosis not present

## 2021-06-12 DIAGNOSIS — Z90710 Acquired absence of both cervix and uterus: Secondary | ICD-10-CM | POA: Insufficient documentation

## 2021-06-12 DIAGNOSIS — F32A Depression, unspecified: Secondary | ICD-10-CM | POA: Diagnosis not present

## 2021-06-12 DIAGNOSIS — Z79811 Long term (current) use of aromatase inhibitors: Secondary | ICD-10-CM | POA: Diagnosis not present

## 2021-06-12 DIAGNOSIS — Z809 Family history of malignant neoplasm, unspecified: Secondary | ICD-10-CM | POA: Diagnosis not present

## 2021-06-12 DIAGNOSIS — E2839 Other primary ovarian failure: Secondary | ICD-10-CM | POA: Diagnosis not present

## 2021-06-12 DIAGNOSIS — Z923 Personal history of irradiation: Secondary | ICD-10-CM | POA: Insufficient documentation

## 2021-06-12 DIAGNOSIS — G4730 Sleep apnea, unspecified: Secondary | ICD-10-CM | POA: Insufficient documentation

## 2021-06-12 LAB — URINALYSIS, COMPLETE (UACMP) WITH MICROSCOPIC
Bacteria, UA: NONE SEEN
Bilirubin Urine: NEGATIVE
Glucose, UA: NEGATIVE mg/dL
Hgb urine dipstick: NEGATIVE
Ketones, ur: NEGATIVE mg/dL
Leukocytes,Ua: NEGATIVE
Nitrite: NEGATIVE
Protein, ur: NEGATIVE mg/dL
Specific Gravity, Urine: 1.015 (ref 1.005–1.030)
pH: 5 (ref 5.0–8.0)

## 2021-06-12 NOTE — Telephone Encounter (Signed)
Called pt per LC to inform her that UA was normal. Pt verbalized thanks and understanding.

## 2021-06-12 NOTE — Progress Notes (Signed)
Referral for GI through [facility] health Dr [facility] placed per pt request. Pt is aware and verbalizes thanks.

## 2021-06-12 NOTE — Telephone Encounter (Signed)
-----   Message from Gardenia Phlegm, NP sent at 06/12/2021  2:22 PM EDT ----- Please let patient know that her urine is normal.   ----- Message ----- From: Interface, Lab In [place] Sent: 06/12/2021  12:13 PM EDT To: Gardenia Phlegm, NP

## 2021-06-13 ENCOUNTER — Telehealth: Payer: Self-pay | Admitting: Oncology

## 2021-06-13 LAB — URINE CULTURE: Culture: <10000 — AB

## 2021-06-13 NOTE — Telephone Encounter (Signed)
Sch per 7/7 los, left message

## 2021-06-18 ENCOUNTER — Ambulatory Visit (HOSPITAL_BASED_OUTPATIENT_CLINIC_OR_DEPARTMENT_OTHER): Payer: BC Managed Care – PPO

## 2021-06-23 ENCOUNTER — Ambulatory Visit (HOSPITAL_BASED_OUTPATIENT_CLINIC_OR_DEPARTMENT_OTHER)
Admission: RE | Admit: 2021-06-23 | Discharge: 2021-06-23 | Disposition: A | Discharge status: Home / Self Care | Payer: BC Managed Care – PPO | Source: Ambulatory Visit | Attending: Adult Health | Admitting: Adult Health

## 2021-06-23 ENCOUNTER — Other Ambulatory Visit: Payer: Self-pay

## 2021-06-23 DIAGNOSIS — E2839 Other primary ovarian failure: Secondary | ICD-10-CM | POA: Insufficient documentation

## 2021-06-26 ENCOUNTER — Telehealth: Payer: Self-pay

## 2021-06-26 NOTE — Telephone Encounter (Signed)
-----   Message from Gardenia Phlegm, NP sent at 06/24/2021  9:21 AM EDT ----- Bone density normal.  Please notify patient ----- Message ----- From: Interface, Rad Results In Sent: 06/23/2021   5:01 PM EDT To: Gardenia Phlegm, NP

## 2021-06-26 NOTE — Telephone Encounter (Signed)
Attempted to call pt to give results. LVM for pt to return call to [facility].

## 2021-07-07 ENCOUNTER — Other Ambulatory Visit: Payer: Self-pay | Admitting: *Deleted

## 2021-07-07 ENCOUNTER — Telehealth: Payer: Self-pay | Admitting: *Deleted

## 2021-07-07 DIAGNOSIS — C50412 Malignant neoplasm of upper-outer quadrant of left female breast: Secondary | ICD-10-CM

## 2021-07-07 DIAGNOSIS — E2839 Other primary ovarian failure: Secondary | ICD-10-CM

## 2021-07-07 NOTE — Telephone Encounter (Signed)
Pt requesting referral to Dr Lisbeth Renshaw, [facility]. States she has never really been followed by a gynecologist. Referral faxed to [phone]

## 2021-07-14 ENCOUNTER — Other Ambulatory Visit: Payer: Self-pay

## 2021-07-14 ENCOUNTER — Ambulatory Visit: Payer: BC Managed Care – PPO | Attending: Surgery

## 2021-07-14 VITALS — Wt 190.1 lb

## 2021-07-14 DIAGNOSIS — C50412 Malignant neoplasm of upper-outer quadrant of left female breast: Secondary | ICD-10-CM | POA: Insufficient documentation

## 2021-07-14 DIAGNOSIS — R293 Abnormal posture: Secondary | ICD-10-CM | POA: Insufficient documentation

## 2021-07-14 DIAGNOSIS — Z170 Estrogen receptor positive status [ER+]: Secondary | ICD-10-CM | POA: Insufficient documentation

## 2021-07-14 DIAGNOSIS — Z483 Aftercare following surgery for neoplasm: Secondary | ICD-10-CM | POA: Insufficient documentation

## 2021-07-14 NOTE — Therapy (Signed)
[place], [place], [address] Phone: [phone]   Fax:  [phone]  Physical Therapy Treatment  Patient Details  Name: Lisa Price MRN: [id number] Date of Birth: 1956-11-30 Referring Provider (PT): Dr. Coralie Keens   Encounter Date: 07/14/2021   PT End of Session - 07/14/21 1549     Visit Number 2   # unchanged due to screen only   PT Start Time 1541    PT Stop Time 1552    PT Time Calculation (min) 11 min    Activity Tolerance Patient tolerated treatment well    Behavior During Therapy [facility] for tasks assessed/performed             Past Medical History:  Diagnosis Date   Anxiety    Depression    Family history of breast cancer 12/25/2020   Family history of thyroid cancer 12/25/2020   Hiatal hernia    History of radiation therapy 02/24/21-03/25/21   Left breast- Dr. Gery Pray   Sleep apnea     Past Surgical History:  Procedure Laterality Date   ABDOMINAL HYSTERECTOMY     BREAST LUMPECTOMY WITH RADIOACTIVE SEED AND SENTINEL LYMPH NODE BIOPSY Left 01/10/2021   Procedure: LEFT BREAST LUMPECTOMY WITH RADIOACTIVE SEED AND SENTINEL LYMPH NODE BIOPSY;  Surgeon: Coralie Keens, MD;  Location: [facility];  Service: General;  Laterality: Left;    Vitals:   07/14/21 1540  Weight: 190 lb 2 oz (86.2 kg)     Subjective Assessment - 07/14/21 1542     Subjective Pt returns for her 3 month L-Dex screen.    Pertinent History Patient was diagnosed on 11/20/2021 with left invasive ductal carcinoma breast cancer.Patient underwent left lumpectomy and sentinel node biopsy (2 negative nodes) on 01/10/2021. It is ER/PR positive and HER2 is pending. Her Ki67 is 3%. She has chronic stomach pain which interferes with daily activities and is also limited by depression and anxiety.                    L-DEX FLOWSHEETS - 07/14/21 1500       L-DEX LYMPHEDEMA SCREENING    Measurement Type Unilateral    L-DEX MEASUREMENT EXTREMITY Upper Extremity    POSITION  Standing    DOMINANT SIDE Right    At Risk Side Left    BASELINE SCORE (UNILATERAL) -2.6    L-DEX SCORE (UNILATERAL) 1.8    VALUE CHANGE (UNILAT) 4.4                                    PT Long Term Goals - 02/06/21 1204       PT LONG TERM GOAL #1   Title Patient will demonstrate she has regained full shoulder ROM and function post operatively compared to baselines.    Time 8    Period Weeks    Status Achieved                   Plan - 07/14/21 1553     Clinical Impression Statement Pt returns for her 3 month L-Dex screen. Her change from baseline of 4.4 is WNLs so no lymphedema treatment is required at this time. Pt does have complaints of increased tightness and pulling at Lt axilla and trunk since completeing radiation in April so would like to start coming for physical therapy. Request for referral sent to Dr.  MAgrinats affice and pt scheduled eval.    PT Next Visit Plan Cont every 3 month L-Dex screens for up to 2 years from her SLNB.    Consulted and Agree with Plan of Care Patient             Patient will benefit from skilled therapeutic intervention in order to improve the following deficits and impairments:     Visit Diagnosis: Aftercare following surgery for neoplasm     Problem List Patient Active Problem List   Diagnosis Date Noted   Genetic testing 01/03/2021   Family history of breast cancer 12/25/2020   Family history of thyroid cancer 12/25/2020   Malignant neoplasm of upper-outer quadrant of left breast in female, estrogen receptor positive ([place]) 12/24/2020    [name], PTA 07/14/2021, 3:58 PM  [place], [place], [address] Phone: [phone]   Fax:  [phone]  Name: Lisa Price MRN: [id number] Date of Birth: 09/05/1956

## 2021-07-15 ENCOUNTER — Other Ambulatory Visit: Payer: Self-pay | Admitting: Adult Health

## 2021-07-15 DIAGNOSIS — Z170 Estrogen receptor positive status [ER+]: Secondary | ICD-10-CM

## 2021-07-15 NOTE — Progress Notes (Signed)
PT referral placed per patient request as noted by Mateo Flow with PT.    Lisa Bihari, NP

## 2021-07-22 ENCOUNTER — Other Ambulatory Visit: Payer: Self-pay

## 2021-07-22 ENCOUNTER — Ambulatory Visit: Payer: BC Managed Care – PPO

## 2021-07-22 DIAGNOSIS — R293 Abnormal posture: Secondary | ICD-10-CM

## 2021-07-22 DIAGNOSIS — Z483 Aftercare following surgery for neoplasm: Secondary | ICD-10-CM

## 2021-07-22 DIAGNOSIS — Z170 Estrogen receptor positive status [ER+]: Secondary | ICD-10-CM | POA: Diagnosis present

## 2021-07-22 DIAGNOSIS — C50412 Malignant neoplasm of upper-outer quadrant of left female breast: Secondary | ICD-10-CM | POA: Diagnosis present

## 2021-07-22 NOTE — Therapy (Signed)
[place], [place], [address] Phone: [phone]   Fax:  [phone]  Physical Therapy Evaluation  Patient Details  Name: Lisa Price MRN: [id number] Date of Birth: 04-Oct-1956 Referring Provider (PT): Wilber Bihari NP   Encounter Date: 07/22/2021   PT End of Session - 07/22/21 1716     Visit Number 1    Number of Visits 5    Date for PT Re-Evaluation 08/19/21    PT Start Time 4580    PT Stop Time 1702    PT Time Calculation (min) 51 min    Activity Tolerance Patient tolerated treatment well    Behavior During Therapy [facility] for tasks assessed/performed             Past Medical History:  Diagnosis Date   Anxiety    Depression    Family history of breast cancer 12/25/2020   Family history of thyroid cancer 12/25/2020   Hiatal hernia    History of radiation therapy 02/24/21-03/25/21   Left breast- Dr. Gery Pray   Sleep apnea     Past Surgical History:  Procedure Laterality Date   ABDOMINAL HYSTERECTOMY     BREAST LUMPECTOMY WITH RADIOACTIVE SEED AND SENTINEL LYMPH NODE BIOPSY Left 01/10/2021   Procedure: LEFT BREAST LUMPECTOMY WITH RADIOACTIVE SEED AND SENTINEL LYMPH NODE BIOPSY;  Surgeon: Coralie Keens, MD;  Location: [facility];  Service: General;  Laterality: Left;    There were no vitals filed for this visit.    Subjective Assessment - 07/22/21 1615     Subjective Saw Lisa Price for her screen and expressed concern with left axillary tightness and limitations in motion. Finished Radiation in April and more recently noticed a change.    Pertinent History Patient was diagnosed on 11/20/2021 with left invasive ductal carcinoma breast cancer.Patient underwent left lumpectomy and sentinel node biopsy (2 negative nodes) on 01/10/2021. It is ER/PR positive and HER2 is pending. Her Ki67 is 3%. She has chronic stomach pain which interferes with daily activities and is also limited  by depression and anxiety.    Patient Stated Goals Make sure my arm is doing ok    Currently in Pain? No/denies    Pain Score 0-No pain    Pain Onset More than a month ago    Pain Frequency Intermittent    Aggravating Factors  reaching overhead                [facility] PT Assessment - 07/22/21 0001       Assessment   Medical Diagnosis s/p left lumpectomy and SLNB    Referring Provider (PT) Wilber Bihari NP    Onset Date/Surgical Date 01/10/21    Hand Dominance Right      Precautions   Precaution Comments lymphedema risk      Balance Screen   Has the patient fallen in the past 6 months No    Has the patient had a decrease in activity level because of a fear of falling?  No    Is the patient reluctant to leave their home because of a fear of falling?  No      Home Social worker Private residence    Living Arrangements Spouse/significant other    Available Help at Discharge Family      Prior Function   Level of Independence Independent    Vocation Unemployed    Leisure gardening      Cognition   Overall Cognitive  Status Within Functional Limits for tasks assessed      Observation/Other Assessments   Observations incisions well healed. mild scar tissue noted at areolar incision. no swelling noted      Posture/Postural Control   Posture/Postural Control Postural limitations    Postural Limitations Rounded Shoulders;Forward head      ROM / Strength   AROM / PROM / Strength AROM      AROM   Right Shoulder Extension 45 Degrees    Right Shoulder Flexion 157 Degrees    Right Shoulder ABduction 175 Degrees    Right Shoulder Internal Rotation --   T5 FIR   Right Shoulder External Rotation 100 Degrees    Left Shoulder Extension 46 Degrees    Left Shoulder Flexion 152 Degrees    Left Shoulder ABduction 165 Degrees    Left Shoulder Internal Rotation --   T5 FIR   Left Shoulder External Rotation 90 Degrees      Strength   Overall Strength Within  functional limits for tasks performed               LYMPHEDEMA/ONCOLOGY QUESTIONNAIRE - 07/22/21 0001       Type   Cancer Type Left breast cancer      Surgeries   Lumpectomy Date 01/10/21    Sentinel Lymph Node Biopsy Date 01/10/21    Number Lymph Nodes Removed 2      Treatment   Active Chemotherapy Treatment No    Past Chemotherapy Treatment No    Active Radiation Treatment No    Past Radiation Treatment Yes    Date --   april 22   Current Hormone Treatment Yes   recommended but trying to decide if she wants to.   Past Hormone Therapy No      What other symptoms do you have   Are you Having Heaviness or Tightness Yes    Are you having Pain No    Are you having pitting edema No    Is it Hard or Difficult finding clothes that fit No    Do you have infections No    Is there Decreased scar mobility No      Left Upper Extremity Lymphedema   10 cm Proximal to Olecranon Process 30.1 cm    Olecranon Process 25.4 cm    10 cm Proximal to Ulnar Styloid Process 22 cm    Just Proximal to Ulnar Styloid Process 16.8 cm    Across Hand at [employer] 20.2 cm    At [place] of 2nd Digit 6.4 cm                     Objective measurements completed on examination: See above findings.               PT Education - 07/22/21 1715     Education Details Educated in shoulder flexion and abduction wall slides to achieve end ranges of motion, pec wall stretch.  Gave script for compression garments    Person(s) Educated Patient    Methods Handout;Demonstration;Explanation    Comprehension Returned demonstration;Tactile cues required                 PT Long Term Goals - 07/22/21 1735       PT LONG TERM GOAL #1   Title Patient will demonstrate she has regained full shoulder ROM and function post operatively compared to baselines.    Time 4    Period Weeks  Status New    Target Date 08/19/21      PT LONG TERM GOAL #2   Title Pt will be independent  in HEP for ROM and strengthening of left shoulder to restore full function    Time 4    Period Weeks    Status New    Target Date 08/19/21      PT LONG TERM GOAL #3   Title Pt will be fit for compression sleeve and be independent with donning/doffing and when to wear    Time 4    Period Weeks    Status New    Target Date 08/19/21                    Plan - 07/22/21 1718     Clinical Impression Statement Pt noticed that over the last month or so she has developed some mild tightness in the axillary region with mild loss of AROM.  She can do most things but feels some tightness at end ranges. She has very mild scar tissue at areolar incision but no visible breast swelling. There is mildly decreased left shoulder AROM when compared to the right. There is no measureable swelling in the arm.  She was instructed in endrange stretching for shoulder flexion and abduction at the wall, as well as pec stretches.  She will benefit from a strengthening program, and compression sleeve for travel or repetetive motions.  She was given a script today for [facility]. Pt was a little anxious initially but relaxed as we continued the eval.  She will benefit from a few session to progress ROM prn and to initiate left UE strengthening and postural education.    Personal Factors and Comorbidities Comorbidity 2    Comorbidities Left breast lumpectomy with SLNB, radiation, anxiety/depression    Examination-Activity Limitations Reach Overhead    Stability/Clinical Decision Making Stable/Uncomplicated    Rehab Potential Excellent    PT Frequency 1x / week    PT Duration 4 weeks    PT Treatment/Interventions ADLs/Self Care Home Management;Therapeutic exercise;Patient/family education;Passive range of motion;Scar mobilization    PT Next Visit Plan End range PROM flex/abduction, AAROM prn, start on strengthening program supine scapular series, Postural strength,Cont every 3 month L-Dex screens for up to 2  years from her SLNB.    PT Home Exercise Plan wall slides flex and abduction, pec wall stretch    Recommended Other Services sleeve (pt has script for [facility])   Consulted and Agree with Plan of Care Patient             Patient will benefit from skilled therapeutic intervention in order to improve the following deficits and impairments:  Postural dysfunction, Decreased range of motion, Decreased knowledge of precautions, Impaired UE functional use, Decreased strength  Visit Diagnosis: Aftercare following surgery for neoplasm  Malignant neoplasm of upper-outer quadrant of left breast in female, estrogen receptor positive ([place])  Abnormal posture     Problem List Patient Active Problem List   Diagnosis Date Noted   Genetic testing 01/03/2021   Family history of breast cancer 12/25/2020   Family history of thyroid cancer 12/25/2020   Malignant neoplasm of upper-outer quadrant of left breast in female, estrogen receptor positive ([place]) 12/24/2020    Claris Pong 07/22/2021, 5:41 PM  [place], [place], [address] Phone: [phone]   Fax:  [phone]  Name: Lisa Price MRN: [id number] Date of  Birth: [birth year] Cheral Almas, PT 07/22/21 5:43 PM

## 2021-07-22 NOTE — Patient Instructions (Signed)
Access Code: [email or web address] URL: [email or web address] Date: 07/22/2021 Prepared by: Cheral Almas  Exercises Doorway Pec Stretch at 90 Degrees Abduction - 2 x daily - 7 x weekly - 1 sets - 3 reps - 20 hold Corner Pec Major Stretch - 2 x daily - 7 x weekly - 1 sets - 3 reps - 20 hold Standing Single Arm Shoulder Flexion Stretch on Wall - 1 x daily - 7 x weekly - 1 sets - 5 reps - 10 hold Also wall slide for abduction;separate picture given

## 2021-07-30 ENCOUNTER — Ambulatory Visit: Payer: BC Managed Care – PPO

## 2021-08-13 ENCOUNTER — Ambulatory Visit: Payer: BC Managed Care – PPO

## 2021-11-03 ENCOUNTER — Ambulatory Visit: Payer: BC Managed Care – PPO | Attending: Surgery

## 2021-11-03 ENCOUNTER — Other Ambulatory Visit: Payer: Self-pay

## 2021-11-03 VITALS — Wt 189.4 lb

## 2021-11-03 DIAGNOSIS — Z483 Aftercare following surgery for neoplasm: Secondary | ICD-10-CM | POA: Insufficient documentation

## 2021-11-03 NOTE — Therapy (Signed)
[facility] [address] [place], [place], [address] Phone: [phone]   Fax:  [phone]  Physical Therapy Treatment  Patient Details  Name: Lisa Price MRN: [id number] Date of Birth: 11-Sep-1956 Referring Provider (PT): Wilber Bihari NP   Encounter Date: 11/03/2021   PT End of Session - 11/03/21 0928     Visit Number 1   # unchanged due to screen only   PT Start Time 0925    PT Stop Time 0930    PT Time Calculation (min) 5 min    Activity Tolerance Patient tolerated treatment well    Behavior During Therapy [facility] for tasks assessed/performed             Past Medical History:  Diagnosis Date   Anxiety    Depression    Family history of breast cancer 12/25/2020   Family history of thyroid cancer 12/25/2020   Hiatal hernia    History of radiation therapy 02/24/21-03/25/21   Left breast- Dr. Gery Pray   Sleep apnea     Past Surgical History:  Procedure Laterality Date   ABDOMINAL HYSTERECTOMY     BREAST LUMPECTOMY WITH RADIOACTIVE SEED AND SENTINEL LYMPH NODE BIOPSY Left 01/10/2021   Procedure: LEFT BREAST LUMPECTOMY WITH RADIOACTIVE SEED AND SENTINEL LYMPH NODE BIOPSY;  Surgeon: Coralie Keens, MD;  Location: [facility];  Service: General;  Laterality: Left;    Vitals:   11/03/21 0927  Weight: 189 lb 6 oz (85.9 kg)     Subjective Assessment - 11/03/21 0926     Subjective Pt returns for her 3 month L-Dex screen. "I didn't return for my treatments because my daughter had a baby and I've been helping her but my axillary tightness is gone."    Pertinent History Patient was diagnosed on 11/20/2021 with left invasive ductal carcinoma breast cancer.Patient underwent left lumpectomy and sentinel node biopsy (2 negative nodes) on 01/10/2021. It is ER/PR positive and HER2 is pending. Her Ki67 is 3%. She has chronic stomach pain which interferes with daily activities and is also limited by depression  and anxiety.                    L-DEX FLOWSHEETS - 11/03/21 0900       L-DEX LYMPHEDEMA SCREENING   Measurement Type Unilateral    L-DEX MEASUREMENT EXTREMITY Upper Extremity    POSITION  Standing    DOMINANT SIDE Right    At Risk Side Left    BASELINE SCORE (UNILATERAL) -2.6    L-DEX SCORE (UNILATERAL) -0.4    VALUE CHANGE (UNILAT) 2.2                                     PT Long Term Goals - 07/22/21 1735       PT LONG TERM GOAL #1   Title Patient will demonstrate she has regained full shoulder ROM and function post operatively compared to baselines.    Time 4    Period Weeks    Status New    Target Date 08/19/21      PT LONG TERM GOAL #2   Title Pt will be independent in HEP for ROM and strengthening of left shoulder to restore full function    Time 4    Period Weeks    Status New    Target Date 08/19/21  PT LONG TERM GOAL #3   Title Pt will be fit for compression sleeve and be independent with donning/doffing and when to wear    Time 4    Period Weeks    Status New    Target Date 08/19/21                   Plan - 11/03/21 0930     Clinical Impression Statement Pt returns for her 3 month L-Dex screen. Her change from baseline of 2.2 is WNLs so no further treatment is required at this time except to cont every 3 month L-Dex screens which pt is agreeable to.    PT Next Visit Plan Cont every 3 month L-Dex screens for up to 2 years from her SLNB (~01/10/23)    Consulted and Agree with Plan of Care Patient             Patient will benefit from skilled therapeutic intervention in order to improve the following deficits and impairments:     Visit Diagnosis: Aftercare following surgery for neoplasm     Problem List Patient Active Problem List   Diagnosis Date Noted   Genetic testing 01/03/2021   Family history of breast cancer 12/25/2020   Family history of thyroid cancer 12/25/2020   Malignant neoplasm  of upper-outer quadrant of left breast in female, estrogen receptor positive ([place]) 12/24/2020    [name], PTA 11/03/2021, 9:33 AM  [facility] @ [address] [place], [place], [address] Phone: [phone]   Fax:  [phone]  Name: Lisa Price MRN: [id number] Date of Birth: May 30, 1956

## 2021-12-10 NOTE — Progress Notes (Signed)
Patient Care Team: Lauro Franklin, MD as PCP - General (Internal Medicine) Coralie Keens, MD as Consulting Physician (General Surgery) Nicholas Lose, MD as Consulting Physician (Hematology and Oncology) Gery Pray, MD as Consulting Physician (Radiation Oncology)  DIAGNOSIS:    ICD-10-CM   1. Malignant neoplasm of upper-outer quadrant of left breast in female, estrogen receptor positive ([place])  C50.412    Z17.0       SUMMARY OF ONCOLOGIC HISTORY: Oncology History  Malignant neoplasm of upper-outer quadrant of left breast in female, estrogen receptor positive ([place])  12/24/2020 Initial Diagnosis   Screening mammogram showed a 0.8cm mass at the 2 o'clock position in the left breast and no left axillary adenopathy. Biopsy showed invasive and in situ carcinoma, grade 2, HER-2 equivocal by IHC (2+), ER/PR+ >95%, Ki67 3%.   12/25/2020 Cancer Staging   Staging form: Breast, AJCC 8th Edition - Clinical stage from 12/25/2020: Stage IA (cT1b, cN0, cM0, G2, ER+, PR+, HER2: Equivocal) - Signed by Nicholas Lose, MD on 12/25/2020    01/01/2021 Genetic Testing   Negative genetic testing: no pathogenic variants detected in [facility] BRCAPlus Panel.  The report date is January 01, 2021.    The BRCAplus panel offered by [employer] and includes sequencing and deletion/duplication analysis for the following 8 genes: ATM, BRCA1, BRCA2, CDH1, CHEK2, PALB2, PTEN, and TP53.    Negative genetic testing: no pathogenic variants detected in [facility] CancerNext-Expanded +RNAinsight.  The report date is January 05, 2021.    The CancerNext-Expanded gene panel offered by [facility] and includes sequencing, rearrangement, and RNA analysis for the following 77 genes: AIP, ALK, APC, ATM, AXIN2, BAP1, BARD1, BLM, BMPR1A, BRCA1, BRCA2, BRIP1, CDC73, CDH1, CDK4, CDKN1B, CDKN2A, CHEK2, CTNNA1, DICER1, FANCC, FH, FLCN, GALNT12, KIF1B, LZTR1, MAX, MEN1, MET, MLH1, MSH2, MSH3, MSH6, MUTYH, NBN, NF1, NF2, NTHL1, PALB2,  PHOX2B, PMS2, POT1, PRKAR1A, PTCH1, PTEN, RAD51C, RAD51D, RB1, RECQL, RET, SDHA, SDHAF2, SDHB, SDHC, SDHD, SMAD4, SMARCA4, SMARCB1, SMARCE1, STK11, SUFU, TMEM127, TP53, TSC1, TSC2, VHL and XRCC2 (sequencing and deletion/duplication); EGFR, EGLN1, HOXB13, KIT, MITF, PDGFRA, POLD1, and POLE (sequencing only); EPCAM and GREM1 (deletion/duplication only).    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   01/10/2021 Cancer Staging   Staging form: Breast, AJCC 8th Edition - Pathologic stage from 01/10/2021: Stage IA (pT1c, pN0, cM0, G2, ER+, PR+, HER2-) - Signed by Gardenia Phlegm, NP on 06/10/2021 Stage prefix: Initial diagnosis Histologic grading system: 3 grade system    01/10/2021 Oncotype testing   7/3%   02/24/2021 - 03/25/2021 Radiation Therapy   Adjuvant radiation Site Technique Total Dose (Gy) Dose per Fx (Gy) Completed Fx Beam Energies  Breast, Left: Breast_Lt 3D 40.05/40.05 2.67 15/15 6X, 10X  Breast, Left: Breast_Lt_Bst 3D 12/12 2 6/6 6X, 10X    04/06/2021 -  Anti-estrogen oral therapy   Anastrozole daily     CHIEF COMPLIANT: Follow-up of left breast cancer  INTERVAL HISTORY: Lisa Price is a 66 y.o. with above-mentioned history of left breast cancer who underwent a left lumpectomy and radiation, . She presents to the clinic today for follow-up.  She has not started antiestrogen therapy because of concerns for side effects.  She gets lots of hot flashes which keep her up at night.  She is willing to try it at this time.  ALLERGIES:  has No Known Allergies.  MEDICATIONS:  Current Outpatient Medications  Medication Sig  Dispense Refill   anastrozole (ARIMIDEX) 1 MG tablet Take 1 tablet (1 mg total) by mouth daily. 90 tablet 3   clonazePAM (KLONOPIN) 1 MG tablet 1 tablet     desvenlafaxine (PRISTIQ) 100 MG 24 hr tablet Take 100 mg by mouth at bedtime.      L-Methylfolate-Algae (DEPLIN 15) 15-90.314 MG CAPS Take 1 tablet by mouth.     lisdexamfetamine (VYVANSE) 70 MG capsule 1 capsule every morning     traZODone (DESYREL) 100 MG tablet trazodone 100 mg tablet     No current facility-administered medications for this visit.    PHYSICAL EXAMINATION: ECOG PERFORMANCE STATUS: 1 - Symptomatic but completely ambulatory  Vitals:   12/11/21 1023  BP: 124/83  Pulse: 99  Resp: 19  Temp: 97.8 F (36.6 C)  SpO2: 100%   Filed Weights   12/11/21 1023  Weight: 189 lb 3.2 oz (85.8 kg)    BREAST: No palpable masses or nodules in either right or left breasts. No palpable axillary supraclavicular or infraclavicular adenopathy no breast tenderness or nipple discharge. (exam performed in the presence of a chaperone)  LABORATORY DATA:  I have reviewed the data as listed CMP Latest Ref Rng & Units 12/25/2020  Glucose 70 - 99 mg/dL 90  BUN 8 - 23 mg/dL 21  Creatinine 0.44 - 1.00 mg/dL 0.78  Sodium 135 - 145 mmol/L 140  Potassium 3.5 - 5.1 mmol/L 4.0  Chloride 98 - 111 mmol/L 107  CO2 22 - 32 mmol/L 24  Calcium 8.9 - 10.3 mg/dL 9.4  Total Protein 6.5 - 8.1 g/dL 7.0  Total Bilirubin 0.3 - 1.2 mg/dL 0.3  Alkaline Phos 38 - 126 U/L 90  AST 15 - 41 U/L 12(L)  ALT 0 - 44 U/L 12    Lab Results  Component Value Date   WBC 9.1 12/25/2020   HGB 13.7 12/25/2020   HCT 41.7 12/25/2020   MCV 83.9 12/25/2020   PLT 352 12/25/2020   NEUTROABS 6.1 12/25/2020    ASSESSMENT & PLAN:  Malignant neoplasm of upper-outer quadrant of left breast in female, estrogen receptor positive ([place]) 01/10/21: Left lumpectomy Ninfa Linden): invasive and in situ ductal carcinoma, 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  HER-2 equivocal by IHC (2+), FISH neg ER/PR+ >95%, Ki67 3%.   Plan: 1. Oncotype Dx 7 (3% ROR) 2. Adj XRT 02/26/21- 03/25/21 3. Adj Anti estrogen therapy   Anastrozole toxicities: Patient has not started anastrozole yet. I  recommended that she try taking it 3 times a week and see how she tolerates it.  She intends to do that.  If she tolerates it well she will continue with the plan.  Breast cancer surveillance: 1.  Breast exam 12/11/2021: Benign 2. mammogram: Will need to be ordered Bone density 06/23/2021: T score -0.7: Normal  Return to clinic in 1 year for follow-up    No orders of the defined types were placed in this encounter.  The patient has a good understanding of the overall plan. she agrees with it. she will call with any problems that may develop before the next visit here.  Total time spent: 20 mins including face to face time and time spent for planning, charting and coordination of care  Rulon Eisenmenger, MD, MPH 12/11/2021  I, Thana Ates, am acting as scribe for Dr. Nicholas Lose.  I have reviewed the above documentation for accuracy and completeness, and I agree with the above.

## 2021-12-11 ENCOUNTER — Other Ambulatory Visit: Payer: Self-pay

## 2021-12-11 ENCOUNTER — Inpatient Hospital Stay: Payer: BC Managed Care – PPO | Attending: Hematology and Oncology | Admitting: Hematology and Oncology

## 2021-12-11 DIAGNOSIS — C50412 Malignant neoplasm of upper-outer quadrant of left female breast: Secondary | ICD-10-CM | POA: Insufficient documentation

## 2021-12-11 DIAGNOSIS — Z79811 Long term (current) use of aromatase inhibitors: Secondary | ICD-10-CM | POA: Insufficient documentation

## 2021-12-11 DIAGNOSIS — Z170 Estrogen receptor positive status [ER+]: Secondary | ICD-10-CM | POA: Diagnosis not present

## 2021-12-11 NOTE — Assessment & Plan Note (Signed)
01/10/21:Left lumpectomy Ninfa Linden): invasive and in situ ductal carcinoma, 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 HER-2 equivocal by IHC (2+), FISH negER/PR+ >95%, Ki67 3%.  Plan: 1. Oncotype Dx 7 (3% ROR) 2. Adj XRT 02/26/21- 03/25/21 3. Adj Anti estrogen therapy  Anastrozole toxicities:  Breast cancer surveillance: 1.  Breast exam 12/11/2021: Benign 2. mammogram: Will need to be ordered Bone density 06/23/2021: T score -0.7: Normal  Return to clinic in 1 year for follow-up

## 2022-02-02 ENCOUNTER — Other Ambulatory Visit: Payer: Self-pay

## 2022-02-02 ENCOUNTER — Ambulatory Visit: Payer: BC Managed Care – PPO | Attending: Surgery

## 2022-02-02 VITALS — Wt 186.1 lb

## 2022-02-02 DIAGNOSIS — Z483 Aftercare following surgery for neoplasm: Secondary | ICD-10-CM

## 2022-02-02 NOTE — Therapy (Signed)
[facility] @ [address] [place], [place], [address] Phone: [phone]   Fax:  [phone]  Physical Therapy Treatment  Patient Details  Name: Lisa Price MRN: [id number] Date of Birth: [birth year] Referring Provider (PT): Wilber Bihari NP   Encounter Date: 02/02/2022   PT End of Session - 02/02/22 0947     Visit Number 1   # unchanged due to screen only   PT Start Time 0944    PT Stop Time 0949    PT Time Calculation (min) 5 min    Activity Tolerance Patient tolerated treatment well    Behavior During Therapy [facility] for tasks assessed/performed             Past Medical History:  Diagnosis Date   Anxiety    Depression    Family history of breast cancer 12/25/2020   Family history of thyroid cancer 12/25/2020   Hiatal hernia    History of radiation therapy 02/24/21-03/25/21   Left breast- Dr. Gery Pray   Sleep apnea     Past Surgical History:  Procedure Laterality Date   ABDOMINAL HYSTERECTOMY     BREAST LUMPECTOMY WITH RADIOACTIVE SEED AND SENTINEL LYMPH NODE BIOPSY Left 01/10/2021   Procedure: LEFT BREAST LUMPECTOMY WITH RADIOACTIVE SEED AND SENTINEL LYMPH NODE BIOPSY;  Surgeon: Coralie Keens, MD;  Location: [facility];  Service: General;  Laterality: Left;    Vitals:   02/02/22 0946  Weight: 186 lb 2 oz (84.4 kg)     Subjective Assessment - 02/02/22 0946     Subjective Pt returns for her 3 month L-Dex screen.    Pertinent History Patient was diagnosed on 11/20/2021 with left invasive ductal carcinoma breast cancer.Patient underwent left lumpectomy and sentinel node biopsy (2 negative nodes) on 01/10/2021. It is ER/PR positive and HER2 is pending. Her Ki67 is 3%. She has chronic stomach pain which interferes with daily activities and is also limited by depression and anxiety.                    L-DEX FLOWSHEETS - 02/02/22 0900       L-DEX LYMPHEDEMA SCREENING   Measurement  Type Unilateral    L-DEX MEASUREMENT EXTREMITY Upper Extremity    POSITION  Standing    DOMINANT SIDE Right    At Risk Side Left    BASELINE SCORE (UNILATERAL) -2.6    L-DEX SCORE (UNILATERAL) -1.4    VALUE CHANGE (UNILAT) 1.2                                     PT Long Term Goals - 07/22/21 1735       PT LONG TERM GOAL #1   Title Patient will demonstrate she has regained full shoulder ROM and function post operatively compared to baselines.    Time 4    Period Weeks    Status New    Target Date 08/19/21      PT LONG TERM GOAL #2   Title Pt will be independent in HEP for ROM and strengthening of left shoulder to restore full function    Time 4    Period Weeks    Status New    Target Date 08/19/21      PT LONG TERM GOAL #3   Title Pt will be fit for compression sleeve and be independent with donning/doffing and when  to wear    Time 4    Period Weeks    Status New    Target Date 08/19/21                   Plan - 02/02/22 0950     Clinical Impression Statement Pt returns for her 3 month L-Dex screen. Her change from baseline of 1.2 is WNLs so no further treatment is required at this time except to cont every 3 month L-Dex screens which pt is agreeable to.    PT Next Visit Plan Cont every 3 month L-Dex screens for up to 2 years from her SLNB (~01/10/23)    Consulted and Agree with Plan of Care Patient             Patient will benefit from skilled therapeutic intervention in order to improve the following deficits and impairments:     Visit Diagnosis: Aftercare following surgery for neoplasm     Problem List Patient Active Problem List   Diagnosis Date Noted   Genetic testing 01/03/2021   Family history of breast cancer 12/25/2020   Family history of thyroid cancer 12/25/2020   Malignant neoplasm of upper-outer quadrant of left breast in female, estrogen receptor positive ([place]) 12/24/2020    [name],  PTA 02/02/2022, 10:34 AM  [facility] @ [address] [place], [place], [address] Phone: [phone]   Fax:  [phone]  Name: FRAIDY MCCARRICK MRN: [id number] Date of Birth: 31-Jan-1956

## 2022-02-12 ENCOUNTER — Ambulatory Visit
Admission: RE | Admit: 2022-02-12 | Discharge: 2022-02-12 | Disposition: A | Discharge status: Home / Self Care | Payer: BC Managed Care – PPO | Source: Ambulatory Visit | Attending: Adult Health | Admitting: Adult Health

## 2022-02-12 DIAGNOSIS — C50412 Malignant neoplasm of upper-outer quadrant of left female breast: Secondary | ICD-10-CM

## 2022-02-12 HISTORY — DX: Personal history of irradiation: Z92.3

## 2022-04-14 ENCOUNTER — Encounter (HOSPITAL_COMMUNITY): Payer: Self-pay

## 2022-05-11 ENCOUNTER — Ambulatory Visit: Payer: BC Managed Care – PPO | Attending: Surgery

## 2022-05-11 VITALS — Wt 184.5 lb

## 2022-05-11 DIAGNOSIS — Z483 Aftercare following surgery for neoplasm: Secondary | ICD-10-CM | POA: Insufficient documentation

## 2022-05-11 NOTE — Therapy (Signed)
  OUTPATIENT PHYSICAL THERAPY SOZO SCREENING NOTE   Patient Name: Lisa Price MRN: [id number] DOB:1956/09/13, 66 y.o., female Today's Date: 05/11/2022  PCP: Lauro Franklin, MD REFERRING PROVIDER: Coralie Keens, MD   PT End of Session - 05/11/22 1105     Visit Number 1   # unchanged due to screen only   PT Start Time 1103    PT Stop Time 1107    PT Time Calculation (min) 4 min    Activity Tolerance Patient tolerated treatment well    Behavior During Therapy [facility] for tasks assessed/performed             Past Medical History:  Diagnosis Date   Anxiety    Depression    Family history of breast cancer 12/25/2020   Family history of thyroid cancer 12/25/2020   Hiatal hernia    History of radiation therapy 02/24/21-03/25/21   Left breast- Dr. Gery Pray   Personal history of radiation therapy    Sleep apnea    Past Surgical History:  Procedure Laterality Date   ABDOMINAL HYSTERECTOMY     BREAST BIOPSY Left 12/19/2020   BREAST LUMPECTOMY Left 01/2021   BREAST LUMPECTOMY WITH RADIOACTIVE SEED AND SENTINEL LYMPH NODE BIOPSY Left 01/10/2021   Procedure: LEFT BREAST LUMPECTOMY WITH RADIOACTIVE SEED AND SENTINEL LYMPH NODE BIOPSY;  Surgeon: Coralie Keens, MD;  Location: [facility];  Service: General;  Laterality: Left;   Patient Active Problem List   Diagnosis Date Noted   Genetic testing 01/03/2021   Family history of breast cancer 12/25/2020   Family history of thyroid cancer 12/25/2020   Malignant neoplasm of upper-outer quadrant of left breast in female, estrogen receptor positive ([place]) 12/24/2020    REFERRING DIAG: left breast cancer at risk for lymphedema  THERAPY DIAG:  Aftercare following surgery for neoplasm  PERTINENT HISTORY: Patient was diagnosed on 11/20/2021 with left invasive ductal carcinoma breast cancer.Patient underwent left lumpectomy and sentinel node biopsy (2 negative nodes) on 01/10/2021. It is ER/PR positive and HER2 is  pending. Her Ki67 is 3%. She has chronic stomach pain which interferes with daily activities and is also limited by depression and anxiety  PRECAUTIONS: left UE Lymphedema risk, None  SUBJECTIVE: Pt returns for her 3 month L-Dex screen.   PAIN:  Are you having pain? No  SOZO SCREENING: Patient was assessed today using the SOZO machine to determine the lymphedema index score. This was compared to her baseline score. It was determined that she is within the recommended range when compared to her baseline and no further action is needed at this time. She will continue SOZO screenings. These are done every 3 months for 2 years post operatively followed by every 6 months for 2 years, and then annually.    [name], PTA 05/11/2022, 3:46 PM

## 2022-06-23 ENCOUNTER — Other Ambulatory Visit: Payer: Self-pay | Admitting: Internal Medicine

## 2022-06-23 DIAGNOSIS — R1084 Generalized abdominal pain: Secondary | ICD-10-CM

## 2022-06-24 ENCOUNTER — Ambulatory Visit
Admission: RE | Admit: 2022-06-24 | Discharge: 2022-06-24 | Disposition: A | Discharge status: Home / Self Care | Payer: BC Managed Care – PPO | Source: Ambulatory Visit | Attending: Internal Medicine | Admitting: Internal Medicine

## 2022-06-24 DIAGNOSIS — R1084 Generalized abdominal pain: Secondary | ICD-10-CM

## 2022-06-24 MED ORDER — IOPAMIDOL (ISOVUE-300) INJECTION 61%
100.0000 mL | Freq: Once | INTRAVENOUS | Status: AC | PRN
  Administered 2022-06-24: 100 mL via INTRAVENOUS

## 2022-06-25 ENCOUNTER — Inpatient Hospital Stay: Admission: RE | Admit: 2022-06-25 | Payer: BC Managed Care – PPO | Source: Ambulatory Visit

## 2022-07-21 ENCOUNTER — Other Ambulatory Visit: Payer: Self-pay | Admitting: Internal Medicine

## 2022-07-21 ENCOUNTER — Other Ambulatory Visit: Payer: Self-pay

## 2022-07-21 ENCOUNTER — Encounter (HOSPITAL_COMMUNITY): Payer: Self-pay

## 2022-07-21 ENCOUNTER — Ambulatory Visit
Admission: RE | Admit: 2022-07-21 | Discharge: 2022-07-21 | Disposition: A | Discharge status: Home / Self Care | Payer: BC Managed Care – PPO | Source: Ambulatory Visit | Attending: Internal Medicine | Admitting: Internal Medicine

## 2022-07-21 ENCOUNTER — Inpatient Hospital Stay (HOSPITAL_COMMUNITY)
Admission: EM | Admit: 2022-07-21 | Discharge: 2022-07-24 | DRG: 391 | Disposition: A | Discharge status: Home / Self Care | Payer: BC Managed Care – PPO | Attending: Family Medicine | Admitting: Family Medicine

## 2022-07-21 DIAGNOSIS — T451X6A Underdosing of antineoplastic and immunosuppressive drugs, initial encounter: Secondary | ICD-10-CM | POA: Diagnosis present

## 2022-07-21 DIAGNOSIS — C50412 Malignant neoplasm of upper-outer quadrant of left female breast: Secondary | ICD-10-CM | POA: Diagnosis present

## 2022-07-21 DIAGNOSIS — Z803 Family history of malignant neoplasm of breast: Secondary | ICD-10-CM | POA: Diagnosis not present

## 2022-07-21 DIAGNOSIS — Z808 Family history of malignant neoplasm of other organs or systems: Secondary | ICD-10-CM | POA: Diagnosis not present

## 2022-07-21 DIAGNOSIS — F419 Anxiety disorder, unspecified: Secondary | ICD-10-CM | POA: Diagnosis present

## 2022-07-21 DIAGNOSIS — Z170 Estrogen receptor positive status [ER+]: Secondary | ICD-10-CM

## 2022-07-21 DIAGNOSIS — K5720 Diverticulitis of large intestine with perforation and abscess without bleeding: Principal | ICD-10-CM | POA: Diagnosis present

## 2022-07-21 DIAGNOSIS — Z79891 Long term (current) use of opiate analgesic: Secondary | ICD-10-CM | POA: Diagnosis not present

## 2022-07-21 DIAGNOSIS — Z90710 Acquired absence of both cervix and uterus: Secondary | ICD-10-CM

## 2022-07-21 DIAGNOSIS — K5792 Diverticulitis of intestine, part unspecified, without perforation or abscess without bleeding: Secondary | ICD-10-CM

## 2022-07-21 DIAGNOSIS — K5900 Constipation, unspecified: Secondary | ICD-10-CM | POA: Diagnosis present

## 2022-07-21 DIAGNOSIS — F32A Depression, unspecified: Secondary | ICD-10-CM | POA: Diagnosis present

## 2022-07-21 DIAGNOSIS — Z853 Personal history of malignant neoplasm of breast: Secondary | ICD-10-CM

## 2022-07-21 DIAGNOSIS — Z923 Personal history of irradiation: Secondary | ICD-10-CM

## 2022-07-21 DIAGNOSIS — K582 Mixed irritable bowel syndrome: Secondary | ICD-10-CM | POA: Diagnosis present

## 2022-07-21 DIAGNOSIS — R1032 Left lower quadrant pain: Secondary | ICD-10-CM

## 2022-07-21 DIAGNOSIS — K5732 Diverticulitis of large intestine without perforation or abscess without bleeding: Principal | ICD-10-CM

## 2022-07-21 DIAGNOSIS — G4733 Obstructive sleep apnea (adult) (pediatric): Secondary | ICD-10-CM | POA: Diagnosis present

## 2022-07-21 DIAGNOSIS — K651 Peritoneal abscess: Secondary | ICD-10-CM | POA: Diagnosis present

## 2022-07-21 DIAGNOSIS — Z91148 Patient's other noncompliance with medication regimen for other reason: Secondary | ICD-10-CM

## 2022-07-21 DIAGNOSIS — K639 Disease of intestine, unspecified: Secondary | ICD-10-CM

## 2022-07-21 DIAGNOSIS — K5780 Diverticulitis of intestine, part unspecified, with perforation and abscess without bleeding: Secondary | ICD-10-CM | POA: Diagnosis present

## 2022-07-21 LAB — COMPREHENSIVE METABOLIC PANEL
ALT: 13 U/L (ref 0–44)
AST: 15 U/L (ref 15–41)
Albumin: 3.8 g/dL (ref 3.5–5.0)
Alkaline Phosphatase: 72 U/L (ref 38–126)
Anion gap: 7 (ref 5–15)
BUN: 15 mg/dL (ref 8–23)
CO2: 26 mmol/L (ref 22–32)
Calcium: 9.3 mg/dL (ref 8.9–10.3)
Chloride: 105 mmol/L (ref 98–111)
Creatinine, Ser: 0.66 mg/dL (ref 0.44–1.00)
GFR, Estimated: >60 mL/min (ref >60)
Glucose, Bld: 103 mg/dL — ABNORMAL HIGH (ref 70–99)
Potassium: 3.7 mmol/L (ref 3.5–5.1)
Sodium: 138 mmol/L (ref 135–145)
Total Bilirubin: 0.5 mg/dL (ref 0.3–1.2)
Total Protein: 7 g/dL (ref 6.5–8.1)

## 2022-07-21 LAB — CBC WITH DIFFERENTIAL/PLATELET
Abs Immature Granulocytes: 0.04 10*3/uL (ref 0.00–0.07)
Basophils Absolute: 0.1 10*3/uL (ref 0.0–0.1)
Basophils Relative: 0 %
Eosinophils Absolute: 0.2 10*3/uL (ref 0.0–0.5)
Eosinophils Relative: 1 %
HCT: 41.9 % (ref 36.0–46.0)
Hemoglobin: 13.8 g/dL (ref 12.0–15.0)
Immature Granulocytes: 0 %
Lymphocytes Relative: 13 %
Lymphs Abs: 1.8 10*3/uL (ref 0.7–4.0)
MCH: 28 pg (ref 26.0–34.0)
MCHC: 32.9 g/dL (ref 30.0–36.0)
MCV: 85.2 fL (ref 80.0–100.0)
Monocytes Absolute: 1.3 10*3/uL — ABNORMAL HIGH (ref 0.1–1.0)
Monocytes Relative: 9 %
Neutro Abs: 10.7 10*3/uL — ABNORMAL HIGH (ref 1.7–7.7)
Neutrophils Relative %: 77 %
Platelets: 347 10*3/uL (ref 150–400)
RBC: 4.92 MIL/uL (ref 3.87–5.11)
RDW: 13.2 % (ref 11.5–15.5)
WBC: 14 10*3/uL — ABNORMAL HIGH (ref 4.0–10.5)
nRBC: 0 % (ref 0.0–0.2)

## 2022-07-21 LAB — URINALYSIS, MICROSCOPIC (REFLEX)

## 2022-07-21 LAB — URINALYSIS, ROUTINE W REFLEX MICROSCOPIC
Bilirubin Urine: NEGATIVE
Glucose, UA: NEGATIVE mg/dL
Ketones, ur: NEGATIVE mg/dL
Nitrite: NEGATIVE
Specific Gravity, Urine: <1.005 — ABNORMAL LOW (ref 1.005–1.030)
pH: 6 (ref 5.0–8.0)

## 2022-07-21 LAB — LIPASE, BLOOD: Lipase: 25 U/L (ref 11–51)

## 2022-07-21 MED ORDER — LISDEXAMFETAMINE DIMESYLATE 70 MG PO CAPS
70.0000 mg | ORAL_CAPSULE | Freq: Every morning | ORAL | Status: DC
  Administered 2022-07-22 – 2022-07-23 (×2): 70 mg via ORAL
  Filled 2022-07-21 (×2): qty 1

## 2022-07-21 MED ORDER — CEFTRIAXONE SODIUM 1 G IJ SOLR
1.0000 g | Freq: Once | INTRAMUSCULAR | Status: AC
  Administered 2022-07-21: 1 g via INTRAVENOUS
  Filled 2022-07-21: qty 10

## 2022-07-21 MED ORDER — IOPAMIDOL (ISOVUE-300) INJECTION 61%
100.0000 mL | Freq: Once | INTRAVENOUS | Status: AC | PRN
  Administered 2022-07-21: 100 mL via INTRAVENOUS

## 2022-07-21 MED ORDER — METRONIDAZOLE 500 MG/100ML IV SOLN
500.0000 mg | Freq: Two times a day (BID) | INTRAVENOUS | Status: DC
  Administered 2022-07-21 – 2022-07-24 (×6): 500 mg via INTRAVENOUS
  Filled 2022-07-21 (×6): qty 100

## 2022-07-21 MED ORDER — SODIUM CHLORIDE 0.9 % IV SOLN: INTRAVENOUS | Status: AC

## 2022-07-21 MED ORDER — TRAZODONE HCL 50 MG PO TABS
ORAL_TABLET | ORAL | Status: AC
  Filled 2022-07-21: qty 6

## 2022-07-21 MED ORDER — CLONAZEPAM 1 MG PO TABS
1.0000 mg | ORAL_TABLET | Freq: Two times a day (BID) | ORAL | Status: DC | PRN
  Administered 2022-07-22: 1 mg via ORAL
  Filled 2022-07-21: qty 1

## 2022-07-21 MED ORDER — TRAZODONE HCL 50 MG PO TABS
300.0000 mg | ORAL_TABLET | Freq: Every day | ORAL | Status: DC
  Administered 2022-07-21 – 2022-07-23 (×3): 300 mg via ORAL
  Filled 2022-07-21 (×2): qty 6

## 2022-07-21 MED ORDER — VENLAFAXINE HCL ER 150 MG PO CP24
150.0000 mg | ORAL_CAPSULE | Freq: Every day | ORAL | Status: DC
  Administered 2022-07-22 – 2022-07-24 (×3): 150 mg via ORAL
  Filled 2022-07-21 (×3): qty 1

## 2022-07-21 NOTE — ED Triage Notes (Signed)
Pt reports abdominal pain and nausea x2 weeks. Pt had a CT today and was sent here for diverticulitis and pelvic abscess.

## 2022-07-21 NOTE — Consult Note (Signed)
Surgical Evaluation Requesting provider: Teressa Lower MD  Chief Complaint: Abdominal pain  HPI: Very pleasant 66 year old woman who was referred to the emergency room for admission after an outpatient CT scan today demonstrated diverticulitis with abscess. She reports that she has been feeling sick and almost bedridden starting at the beginning of July.  Per her husband, she has had fairly severe lower abdominal/pelvic pain for the last 2 to 3 weeks.  This is associated with decreased appetite, nausea, and constipation. She had a CT scan on July 19 that shows diverticulitis with a small amount of adjacent free fluid but no drainable fluid collection or abscess.  She underwent a couple courses of oral antibiotics without improvement in her symptoms, and so underwent a repeat CT scan today with findings as noted.  She reports that she has undergone regular colonoscopies at [facility], on review of care everywhere it appears as though she last had a scope in 2019 (report unavailable, this was noted in the GI providers note) with findings of diverticulosis.  She has a history of left breast cancer status post lumpectomy, radiation, and is supposed to be taking anastrozole, sees Dr. Lindi Adie.  Other history includes obstructive sleep apnea, irritable bowel syndrome with both constipation and diarrhea, slow transit constipation, chronic abdominal pain and history of SIBO with suspected functional component to her abdominal pain/constipation per GI notes in care everywhere, depression and anxiety.  She reports she had a hysterectomy but no other abdominal surgery.  No Known Allergies  Past Medical History:  Diagnosis Date   Anxiety    Depression    Family history of breast cancer 12/25/2020   Family history of thyroid cancer 12/25/2020   Hiatal hernia    History of radiation therapy 02/24/21-03/25/21   Left breast- Dr. Gery Pray   Personal history of radiation therapy    Sleep apnea     Past  Surgical History:  Procedure Laterality Date   ABDOMINAL HYSTERECTOMY     BREAST BIOPSY Left 12/19/2020   BREAST LUMPECTOMY Left 01/2021   BREAST LUMPECTOMY WITH RADIOACTIVE SEED AND SENTINEL LYMPH NODE BIOPSY Left 01/10/2021   Procedure: LEFT BREAST LUMPECTOMY WITH RADIOACTIVE SEED AND SENTINEL LYMPH NODE BIOPSY;  Surgeon: Coralie Keens, MD;  Location: [facility];  Service: General;  Laterality: Left;    Family History  Problem Relation Age of Onset   Melanoma Mother    Thyroid cancer Father        unknown age diagnosed   Breast cancer Paternal Grandmother        dx before age 63   Thyroid cancer Maternal Grandmother        dx after age 15    Social History   Socioeconomic History   Marital status: Married    Spouse name: Not on file   Number of children: Not on file   Years of education: Not on file   Highest education level: Not on file  Occupational History   Not on file  Tobacco Use   Smoking status: Never   Smokeless tobacco: Never  Substance and Sexual Activity   Alcohol use: No   Drug use: No   Sexual activity: Not on file  Other Topics Concern   Not on file  Social History Narrative   Not on file   Social Determinants of Health   Financial Resource Strain: Not on file  Food Insecurity: No Food Insecurity (12/25/2020)   Hunger Vital Sign    Worried About Running Out of  Food in the Last Year: Never true    [place] in the Last Year: Never true  Transportation Needs: No Transportation Needs (12/25/2020)   PRAPARE - Hydrologist (Medical): No    Lack of Transportation (Non-Medical): No  Physical Activity: Not on file  Stress: Not on file  Social Connections: Not on file    No current facility-administered medications on file prior to encounter.   Current Outpatient Medications on File Prior to Encounter  Medication Sig Dispense Refill   anastrozole (ARIMIDEX) 1 MG tablet Take 1 tablet (1 mg total)  by mouth daily. 90 tablet 3   clonazePAM (KLONOPIN) 1 MG tablet 1 tablet     L-Methylfolate-Algae (DEPLIN 15) 15-90.314 MG CAPS Take 1 tablet by mouth.     lisdexamfetamine (VYVANSE) 70 MG capsule 1 capsule every morning     traZODone (DESYREL) 100 MG tablet trazodone 100 mg tablet      Review of Systems: a complete, 10pt review of systems was completed with pertinent positives and negatives as documented in the HPI  Physical Exam: Vitals:   07/21/22 1730  BP: 117/76  Pulse: 87  Resp: 16  Temp: 97.8 F (36.6 C)  SpO2: 97%   Gen: A&Ox3, no distress  Eyes: lids and conjunctivae normal, no icterus. Pupils equally round and reactive to light.  Neck: supple without mass or thyromegaly Chest: respiratory effort is normal. No crepitus or tenderness on palpation of the chest.  Cardiovascular: RRR with palpable distal pulses, no pedal edema Gastrointestinal: soft, mildly distended, mildly tender in the lower fields without peritoneal signs.  Well-healed laparoscopic port sites across the mid abdomen, no mass or hernia. Lymphatic: no lymphadenopathy in the neck or groin Muscoloskeletal: no clubbing or cyanosis of the fingers.  Strength is symmetrical throughout.  Range of motion of bilateral upper and lower extremities normal without pain, crepitation or contracture. Neuro: cranial nerves grossly intact.  Sensation intact to light touch diffusely. Psych: appropriate mood and affect, normal insight/judgment intact  Skin: warm and dry      Latest Ref Rng & Units 07/21/2022    5:33 PM 12/25/2020    8:32 AM  CBC  WBC 4.0 - 10.5 K/uL 14.0  9.1   Hemoglobin 12.0 - 15.0 g/dL 13.8  13.7   Hematocrit 36.0 - 46.0 % 41.9  41.7   Platelets 150 - 400 K/uL 347  352        Latest Ref Rng & Units 12/25/2020    8:32 AM  CMP  Glucose 70 - 99 mg/dL 90   BUN 8 - 23 mg/dL 21   Creatinine 0.44 - 1.00 mg/dL 0.78   Sodium 135 - 145 mmol/L 140   Potassium 3.5 - 5.1 mmol/L 4.0   Chloride 98 - 111 mmol/L  107   CO2 22 - 32 mmol/L 24   Calcium 8.9 - 10.3 mg/dL 9.4   Total Protein 6.5 - 8.1 g/dL 7.0   Total Bilirubin 0.3 - 1.2 mg/dL 0.3   Alkaline Phos 38 - 126 U/L 90   AST 15 - 41 U/L 12   ALT 0 - 44 U/L 12     No results found for: "INR", "PROTIME"  Imaging: CT ABDOMEN PELVIS W CONTRAST  Result Date: 07/21/2022 CLINICAL DATA:  Left lower quadrant pain EXAM: CT ABDOMEN AND PELVIS WITH CONTRAST TECHNIQUE: Multidetector CT imaging of the abdomen and pelvis was performed using the standard protocol following bolus administration of intravenous contrast.  RADIATION DOSE REDUCTION: This exam was performed according to the departmental dose-optimization program which includes automated exposure control, adjustment of the mA and/or kV according to patient size and/or use of iterative reconstruction technique. CONTRAST:  135m ISOVUE-300 IOPAMIDOL (ISOVUE-300) INJECTION 61% COMPARISON:  CT abdomen and pelvis dated June 24, 2022 FINDINGS: Lower chest: Small hiatal hernia.  No acute abnormality. Hepatobiliary: No focal liver abnormality is seen. No gallstones, gallbladder wall thickening, or biliary dilatation. Pancreas: Unremarkable. No pancreatic ductal dilatation or surrounding inflammatory changes. Spleen: Normal in size without focal abnormality. Adrenals/Urinary Tract: Adrenal glands are unremarkable. Kidneys are normal, without renal calculi, or hydronephrosis. Exophytic simple cyst of the lower pole of the left kidney, no further follow-up imaging is needed. Wall thickening of the left dome of the bladder. Stomach/Bowel: Marked wall thickening of the sigmoid colon with adjacent inflammatory change and inflamed diverticula and increased pelvic inflammatory change. New pelvic fluid collection measuring 2.5 x 2.5 cm on series 2, image 69 located adjacent to the sigmoid colon, small bowel loop, left dome of the bladder, and left vaginal cuff. Cecum and proximal transverse colon which is partially positioned in  the pelvis. New wall thickening of the portion of the proximal ascending colon and multiple loops of distal small bowel are located in the pelvis, likely reactive. Normal appendix. No evidence of obstruction. Vascular/Lymphatic: No significant vascular findings are present. No enlarged abdominal or pelvic lymph nodes. Reproductive: No adnexal masses. Other: No abdominal wall hernia or abnormality. No abdominopelvic ascites. No extraluminal gas. Musculoskeletal: No acute or significant osseous findings. IMPRESSION: 1. Complicated acute sigmoid diverticulitis with increased pelvic inflammatory change and new small pelvic abscess located adjacent to the sigmoid colon, a small bowel loop, left dome of the bladder, and left vaginal cuff. 2. New wall thickening of a portion of the proximal ascending colon and multiple loops of distal small bowel which are located in the pelvis and increased wall thickening of the left bladder wall, likely reactive. 3. Recommend follow-up colonoscopy or surgical correlation to assess for underlying neoplasm given large degree of sigmoid wall thickening. 4. Aortic Atherosclerosis (ICD10-I70.0). Critical Value/emergent results were called by telephone at the time of interpretation on 07/21/2022 at 1:50 pm AFonnie Birkenhead MA for provider SRochele Raring, who verbally acknowledged these results. Electronically Signed   By: LYetta GlassmanM.D.   On: 07/21/2022 13:56     A/P: Diverticulitis with adjacent 2.5 cm pelvic abscess.  Also noted to have new wall thickening of a portion of the proximal ascending colon and multiple loops of distal small bowel in the pelvis as well as reactive thickening of the left bladder wall.  Mildly tender on exam with leukocytosis but no fever. Recommend medical admission for bowel rest, fluid resuscitation, empiric broad-spectrum IV antibiotics.  Would consult IR in the morning for their opinion although based on the small size anticipate that this is not going to  be drainable at this point..  I discussed with the patient and her husband that the hope is that this process will resolve with antibiotics and bowel rest, but that if medical treatment fails that she will likely require sigmoid colectomy with possible colostomy this admission.  If she does improve with medical therapy, would recommend short interval colonoscopy to rule out malignancy given the somewhat indolent course of this, followed by consultation with colorectal surgery to discuss possible elective sigmoid colectomy.  Surgery team will follow.     Patient Active Problem List   Diagnosis Date Noted  Genetic testing 01/03/2021   Family history of breast cancer 12/25/2020   Family history of thyroid cancer 12/25/2020   Malignant neoplasm of upper-outer quadrant of left breast in female, estrogen receptor positive ([place]) 12/24/2020       Romana Juniper, MD [facility] Surgery, [place]  See AMION to contact appropriate on-call provider

## 2022-07-21 NOTE — ED Provider Triage Note (Signed)
Emergency Medicine Provider Triage Evaluation Note  Lisa Price , a 66 y.o. female  was evaluated in triage.  Pt complains of sigmoid diverticulitis with abscess.  Patient currently being seen by [facility], she has had multiple weeks of feeling ill.  Patient reports that she has had 2 CT scans, last one this afternoon showing sigmoid diverticulitis with abscess.  Patient reports that she has had 2 rounds of antibiotics.  Patient denies any fevers, vomiting.  Patient endorsing nausea, diarrhea without blood.  Patient also has left lower quadrant abdominal pain.  Review of Systems  Positive:  Negative:   Physical Exam  BP 117/76 (BP Location: Left Arm)   Pulse 87   Temp 97.8 F (36.6 C) (Oral)   Resp 16   SpO2 97%  Gen:   Awake, no distress   Resp:  Normal effort  MSK:   Moves extremities without difficulty  Other:  LLQ abdominal pain  Medical Decision Making  Medically screening exam initiated at 5:37 PM.  Appropriate orders placed.  Octavio Graves was informed that the remainder of the evaluation will be completed by another provider, this initial triage assessment does not replace that evaluation, and the importance of remaining in the ED until their evaluation is complete.     Azucena Cecil, PA-C 07/21/22 1738

## 2022-07-22 DIAGNOSIS — K5792 Diverticulitis of intestine, part unspecified, without perforation or abscess without bleeding: Secondary | ICD-10-CM | POA: Diagnosis not present

## 2022-07-22 DIAGNOSIS — K639 Disease of intestine, unspecified: Secondary | ICD-10-CM

## 2022-07-22 DIAGNOSIS — Z853 Personal history of malignant neoplasm of breast: Secondary | ICD-10-CM

## 2022-07-22 LAB — PROTIME-INR
INR: 1.3 — ABNORMAL HIGH (ref 0.8–1.2)
Prothrombin Time: 15.6 seconds — ABNORMAL HIGH (ref 11.4–15.2)

## 2022-07-22 LAB — CBC
HCT: 38.8 % (ref 36.0–46.0)
Hemoglobin: 12.6 g/dL (ref 12.0–15.0)
MCH: 28 pg (ref 26.0–34.0)
MCHC: 32.5 g/dL (ref 30.0–36.0)
MCV: 86.2 fL (ref 80.0–100.0)
Platelets: 282 10*3/uL (ref 150–400)
RBC: 4.5 MIL/uL (ref 3.87–5.11)
RDW: 13.4 % (ref 11.5–15.5)
WBC: 11.2 10*3/uL — ABNORMAL HIGH (ref 4.0–10.5)
nRBC: 0 % (ref 0.0–0.2)

## 2022-07-22 MED ORDER — ACETAMINOPHEN 325 MG PO TABS
ORAL_TABLET | ORAL | Status: AC
  Filled 2022-07-22: qty 2

## 2022-07-22 MED ORDER — SODIUM CHLORIDE 0.9 % IV SOLN
2.0000 g | INTRAVENOUS | Status: DC
  Administered 2022-07-22 – 2022-07-23 (×2): 2 g via INTRAVENOUS
  Filled 2022-07-22 (×2): qty 20

## 2022-07-22 MED ORDER — ACETAMINOPHEN 325 MG PO TABS
650.0000 mg | ORAL_TABLET | Freq: Four times a day (QID) | ORAL | Status: AC | PRN
  Administered 2022-07-22 (×2): 650 mg via ORAL
  Filled 2022-07-22: qty 2

## 2022-07-22 NOTE — Assessment & Plan Note (Signed)
Diverticulitis with adjacent 2.5 pelvic abscess. - Has been evaluated by general surgery Dr. Amado Coe who recommended IR consult to assess for possibility of drainage.  If medical treatment fails she likely require sigmoid colectomy with possible colostomy this admission.  If she does improve, would need colonoscopy to rule out malignancy followed by consultation with colorectal surgery to discuss possible elective sigmoid colectomy. -Continue IV Rocephin and Flagyl

## 2022-07-22 NOTE — H&P (Signed)
History and Physical    Patient: Lisa Price [id number] DOB: [birth year] DOA: 07/21/2022 DOS: the patient was seen and examined on 07/22/2022 PCP: Lauro Franklin, MD  Patient coming from: Home  Chief Complaint:  Chief Complaint  Patient presents with   Abdominal Pain   HPI: Lisa Price is a 66 y.o. female with medical history significant of IBS, OSA, history of breast cancer s/p lumpectomy and radiation who presents with nausea, abdominal discomfort with outpatient CT scan revealing diverticulitis with pelvic abscess.  Patient is a limited historian.  Reports that she has been feeling ill for about a month and a half.  Initially treated for UTI.  She then had CT A/P on 7/19 that showed diverticulitis but no abscess.  She underwent a course of oral antibiotics with about a week of improvement in her symptoms.  However later began to have nausea and issues with constipation.  Had repeat CT scan showing diverticulitis with pelvic abscess and advised to present to the ED.  In the ED, she was afebrile normotensive on room air.  Had leukocytosis of 14..  CMP unremarkable. She was evaluated by general surgery Dr. Amado Coe who recommended medicine admission for bowel rest, f fluid resuscitation and empiric antibiotics.  Recommend IR consult in the morning although based on small size of abscess might not be drainable at this point.  EF medical treatment fails she likely will require sigmoid colectomy with colostomy. review of Systems: As mentioned in the history of present illness. All other systems reviewed and are negative. Past Medical History:  Diagnosis Date   Anxiety    Depression    Family history of breast cancer 12/25/2020   Family history of thyroid cancer 12/25/2020   Hiatal hernia    History of radiation therapy 02/24/21-03/25/21   Left breast- Dr. Gery Pray   Personal history of radiation therapy    Sleep apnea    Past Surgical History:  Procedure Laterality Date    ABDOMINAL HYSTERECTOMY     BREAST BIOPSY Left 12/19/2020   BREAST LUMPECTOMY Left 01/2021   BREAST LUMPECTOMY WITH RADIOACTIVE SEED AND SENTINEL LYMPH NODE BIOPSY Left 01/10/2021   Procedure: LEFT BREAST LUMPECTOMY WITH RADIOACTIVE SEED AND SENTINEL LYMPH NODE BIOPSY;  Surgeon: Coralie Keens, MD;  Location: [facility];  Service: General;  Laterality: Left;   Social History:  reports that she has never smoked. She has never used smokeless tobacco. She reports that she does not drink alcohol and does not use drugs.  No Known Allergies  Family History  Problem Relation Age of Onset   Melanoma Mother    Thyroid cancer Father        unknown age diagnosed   Breast cancer Paternal Grandmother        dx before age 81   Thyroid cancer Maternal Grandmother        dx after age 55    Prior to Admission medications   Medication Sig Start Date End Date Taking? Authorizing Provider  clonazePAM (KLONOPIN) 1 MG tablet Take 1 mg by mouth See admin instructions. Take 1 mg by mouth two to three times a days as needed for anxiety 07/16/18  Yes [provider]  desvenlafaxine (PRISTIQ) 100 MG 24 hr tablet Take 100 mg by mouth in the morning.   Yes [provider]  traZODone (DESYREL) 100 MG tablet Take 300 mg by mouth at bedtime. 11/08/20  Yes [provider]  VYVANSE 70 MG capsule Take 70 mg  by mouth in the morning.   Yes [provider]  anastrozole (ARIMIDEX) 1 MG tablet Take 1 tablet (1 mg total) by mouth daily. Patient not taking: Reported on 07/21/2022 04/06/21   Nicholas Lose, MD    Physical Exam: Vitals:   07/21/22 1930 07/21/22 2136 07/21/22 2140 07/22/22 0131  BP: 120/74 112/77  (!) 106/57  Pulse: 77 74  73  Resp: '17 18  18  '$ Temp:   97.9 F (36.6 C) 99.1 F (37.3 C)  TempSrc:   Oral Oral  SpO2: 98% 99%  96%   Constitutional: NAD, calm, comfortable, elderly female appearing older than stated age laying in bed Eyes: , lids and  conjunctivae normal ENMT: Mucous membranes are moist.  Neck: normal, supple, Respiratory: clear to auscultation bilaterally, no wheezing, no crackles. Normal respiratory effort. No accessory muscle use.  Cardiovascular: Regular rate and rhythm, no murmurs / rubs / gallops. No extremity edema. Abdomen: Soft, nondistended, mild left lower quadrant tenderness bowel sounds positive.  Musculoskeletal: no clubbing / cyanosis. No joint deformity upper and lower extremities. Skin: no rashes, lesions, ulcers. No induration Neurologic: CN 2-12 grossly intact. Strength 5/5 in all 4.  Slow speech. Psychiatric: Normal judgment and insight. Alert and oriented x 3. Normal mood. Data Reviewed:  See HPI  Assessment and Plan: * Acute diverticulitis Diverticulitis with adjacent 2.5 pelvic abscess. - Has been evaluated by general surgery Dr. Amado Coe who recommended IR consult to assess for possibility of drainage.  If medical treatment fails she likely require sigmoid colectomy with possible colostomy this admission.  If she does improve, would need colonoscopy to rule out malignancy followed by consultation with colorectal surgery to discuss possible elective sigmoid colectomy. -Continue IV Rocephin and Flagyl  Colonic thickening CT A/P showed new wall thickening of the proximal ascending colon and multiple loops of distal small bowel.  There is also large degree of sigmoid wall thickening concerning for malignancy.  Patient eventually will need colonoscopy to further evaluate pending resolution of her diverticulitis.  Per [facility] GI clinic documentation, she had EGD and colonoscopy in 2019 with 2 cm hiatal hernia and diverticulosis.  Had biopsies within normal limits.  Had SIBO and was treated with rifaximin.  History of breast cancer History of left breast cancer s/p lumpectomy and radiation. -Supposed to be on anastrozole but appears to not be taking due to concerns for side effects       Advance Care Planning:   Code Status: Full Code   Consults: General surgery  Family Communication: none At bedside  Severity of Illness: The appropriate patient status for this patient is INPATIENT. Inpatient status is judged to be reasonable and necessary in order to provide the required intensity of service to ensure the patient's safety. The patient's presenting symptoms, physical exam findings, and initial radiographic and laboratory data in the context of their chronic comorbidities is felt to place them at high risk for further clinical deterioration. Furthermore, it is not anticipated that the patient will be medically stable for discharge from the hospital within 2 midnights of admission.   * I certify that at the point of admission it is my clinical judgment that the patient will require inpatient hospital care spanning beyond 2 midnights from the point of admission due to high intensity of service, high risk for further deterioration and high frequency of surveillance required.*  Author: Orene Desanctis, DO 07/22/2022 3:22 AM  For on call review [email or web address].

## 2022-07-22 NOTE — Progress Notes (Signed)
[facility] Progress Note     Subjective: CC:  Reports her pain as about the same and describes it as discomfort. Reports a soft brown stool this AM. Denies issues with urination and does reports some mild abdominal pain when she urinates. States she lives with her husband who will be coming to hospital today to visit.  Objective: Vital signs in last 24 hours: Temp:  [97.8 F (36.6 C)-99.1 F (37.3 C)] 98.6 F (37 C) (08/16 0534) Pulse Rate:  [73-87] 86 (08/16 0534) Resp:  [16-18] 18 (08/16 0534) BP: (95-120)/(57-77) 95/59 (08/16 0534) SpO2:  [94 %-99 %] 94 % (08/16 0534) Last BM Date : 07/22/22  Intake/Output from previous day: 08/15 0701 - 08/16 0700 In: 766 [P.O.:30; I.V.:535.3; IV Piggyback:200.7] Out: -  Intake/Output this shift: No intake/output data recorded.  PE: Gen:  Alert, NAD, cooperative Pulm:  Normal effort ORA Abd: Soft, non-distended, mild TTP LLQ and SP regions without rebound tenderness or guarding, no palpable organomegaly.  Skin: warm and dry, no rashes  Psych: A&Ox3   Lab Results:  Recent Labs    07/21/22 1733  WBC 14.0*  HGB 13.8  HCT 41.9  PLT 347   BMET Recent Labs    07/21/22 1733  NA 138  K 3.7  CL 105  CO2 26  GLUCOSE 103*  BUN 15  CREATININE 0.66  CALCIUM 9.3   PT/INR No results for input(s): "LABPROT", "INR" in the last 72 hours. CMP     Component Value Date/Time   NA 138 07/21/2022 1733   K 3.7 07/21/2022 1733   CL 105 07/21/2022 1733   CO2 26 07/21/2022 1733   GLUCOSE 103 (H) 07/21/2022 1733   BUN 15 07/21/2022 1733   CREATININE 0.66 07/21/2022 1733   CREATININE 0.78 12/25/2020 0832   CALCIUM 9.3 07/21/2022 1733   PROT 7.0 07/21/2022 1733   ALBUMIN 3.8 07/21/2022 1733   AST 15 07/21/2022 1733   AST 12 (L) 12/25/2020 0832   ALT 13 07/21/2022 1733   ALT 12 12/25/2020 0832   ALKPHOS 72 07/21/2022 1733   BILITOT 0.5 07/21/2022 1733   BILITOT 0.3 12/25/2020 0832   GFRNONAA >60 07/21/2022 1733    GFRNONAA >60 12/25/2020 0832   Lipase     Component Value Date/Time   LIPASE 25 07/21/2022 1733       Studies/Results: CT ABDOMEN PELVIS W CONTRAST  Result Date: 07/21/2022 CLINICAL DATA:  Left lower quadrant pain EXAM: CT ABDOMEN AND PELVIS WITH CONTRAST TECHNIQUE: Multidetector CT imaging of the abdomen and pelvis was performed using the standard protocol following bolus administration of intravenous contrast. RADIATION DOSE REDUCTION: This exam was performed according to the departmental dose-optimization program which includes automated exposure control, adjustment of the mA and/or kV according to patient size and/or use of iterative reconstruction technique. CONTRAST:  121m ISOVUE-300 IOPAMIDOL (ISOVUE-300) INJECTION 61% COMPARISON:  CT abdomen and pelvis dated June 24, 2022 FINDINGS: Lower chest: Small hiatal hernia.  No acute abnormality. Hepatobiliary: No focal liver abnormality is seen. No gallstones, gallbladder wall thickening, or biliary dilatation. Pancreas: Unremarkable. No pancreatic ductal dilatation or surrounding inflammatory changes. Spleen: Normal in size without focal abnormality. Adrenals/Urinary Tract: Adrenal glands are unremarkable. Kidneys are normal, without renal calculi, or hydronephrosis. Exophytic simple cyst of the lower pole of the left kidney, no further follow-up imaging is needed. Wall thickening of the left dome of the bladder. Stomach/Bowel: Marked wall thickening of the sigmoid colon with adjacent inflammatory change and inflamed diverticula and  increased pelvic inflammatory change. New pelvic fluid collection measuring 2.5 x 2.5 cm on series 2, image 69 located adjacent to the sigmoid colon, small bowel loop, left dome of the bladder, and left vaginal cuff. Cecum and proximal transverse colon which is partially positioned in the pelvis. New wall thickening of the portion of the proximal ascending colon and multiple loops of distal small bowel are located in the  pelvis, likely reactive. Normal appendix. No evidence of obstruction. Vascular/Lymphatic: No significant vascular findings are present. No enlarged abdominal or pelvic lymph nodes. Reproductive: No adnexal masses. Other: No abdominal wall hernia or abnormality. No abdominopelvic ascites. No extraluminal gas. Musculoskeletal: No acute or significant osseous findings. IMPRESSION: 1. Complicated acute sigmoid diverticulitis with increased pelvic inflammatory change and new small pelvic abscess located adjacent to the sigmoid colon, a small bowel loop, left dome of the bladder, and left vaginal cuff. 2. New wall thickening of a portion of the proximal ascending colon and multiple loops of distal small bowel which are located in the pelvis and increased wall thickening of the left bladder wall, likely reactive. 3. Recommend follow-up colonoscopy or surgical correlation to assess for underlying neoplasm given large degree of sigmoid wall thickening. 4. Aortic Atherosclerosis (ICD10-I70.0). Critical Value/emergent results were called by telephone at the time of interpretation on 07/21/2022 at 1:50 pm Fonnie Birkenhead, MA for provider Rochele Raring , who verbally acknowledged these results. Electronically Signed   By: Yetta Glassman M.D.   On: 07/21/2022 13:56    Anti-infectives: Anti-infectives (From admission, onward)    Start     Dose/Rate Route Frequency Ordered Stop   07/22/22 2000  cefTRIAXone (ROCEPHIN) 2 g in sodium chloride 0.9 % 100 mL IVPB        2 g 200 mL/hr over 30 Minutes Intravenous Every 24 hours 07/22/22 0011     07/21/22 1930  cefTRIAXone (ROCEPHIN) 1 g in sodium chloride 0.9 % 100 mL IVPB        1 g 200 mL/hr over 30 Minutes Intravenous  Once 07/21/22 1919 07/21/22 2015   07/21/22 1930  metroNIDAZOLE (FLAGYL) IVPB 500 mg        500 mg 100 mL/hr over 60 Minutes Intravenous Every 12 hours 07/21/22 1919          Assessment/Plan Acute diverticulitis with perforation and 2.5 cm abscess  - CT  8/15 also notable for thickening of the ascending colon and bladder, presumed reactive.  - afebrile, VSS, WBC this AM pending, 14 yesterday - continue IV Rocephin/Flagyl - IR consult to see if abscess is amenable to aspiration/drain placement, though I suspect it may be too small. - ok to advance to [facility] later today if IR does not plan for a procedure.  - [facility] will follow closely with you. No emergent surgical needs today. Hopefully this will improve with non-operative management. Failure to improve would likely result in sigmoid colectomy with colostomy.  - If she does improve with medical therapy, would recommend short interval colonoscopy to rule out malignancy given the somewhat indolent course of this, followed by consultation with colorectal surgery to discuss possible elective sigmoid colectomy.    PMH hysterectomy PMH breast cancer s/p lumpectomy and radiation   LOS: 1 day   I reviewed nursing notes, ED provider notes, hospitalist notes, last 24 h vitals and pain scores, last 48 h intake and output, last 24 h labs and trends, and last 24 h imaging results.    Obie Dredge, [facility] Please  see Amion for pager number during day hours 7:00am-4:30pm

## 2022-07-22 NOTE — Hospital Course (Signed)
66 year old woman presented with nausea and abdominal pain.  Admitted for diverticulitis with pelvic abscess.  Seen by interventional radiology and general surgery.  Abscess too small for drain placement.  Empiric antibiotics.  Surgery following.  Hopefully will improve with conservative management.

## 2022-07-22 NOTE — Progress Notes (Signed)
Patient ID: Lisa Price, female   DOB: 07/31/1956, 66 y.o.   MRN: [id number] Request received for possible aspiration/drainage of pelvic abscess in pt. Images were reviewed by Dr. Dwaine Gale and he states area is too small at this time to warrant any intervention. He recommends continued antbx therapy and f/u CT in a few days.

## 2022-07-22 NOTE — Assessment & Plan Note (Signed)
CT A/P showed new wall thickening of the proximal ascending colon and multiple loops of distal small bowel.  There is also large degree of sigmoid wall thickening concerning for malignancy.  Patient eventually will need colonoscopy to further evaluate pending resolution of her diverticulitis.  Per [facility] GI clinic documentation, she had EGD and colonoscopy in 2019 with 2 cm hiatal hernia and diverticulosis.  Had biopsies within normal limits.  Had SIBO and was treated with rifaximin.

## 2022-07-22 NOTE — Progress Notes (Signed)
  Progress Note   Patient: Lisa Price [id number] DOB: [birth year] DOA: 07/21/2022     1 DOS: the patient was seen and examined on 07/22/2022   Brief hospital course: 66 year old woman presented with nausea and abdominal pain.  Admitted for diverticulitis with pelvic abscess.  Seen by interventional radiology and general surgery.  Abscess too small for drain placement.  Empiric antibiotics.  Surgery following.  Hopefully will improve with conservative management.  Assessment and Plan: * Acute diverticulitis with adjacent 2.5 pelvic abscess. --Somewhat improved today.  Continue empiric antibiotics.  General surgery following.  Abscess too small to drain.  Hopefully improved with conservative management. -Outpatient colonoscopy after resolution of diverticulitis.-  History of breast cancer History of left breast cancer s/p lumpectomy and radiation. --Supposed to be on anastrozole but appears to not be taking due to concerns for side effects -- Follow-up as an outpatient     Subjective:  Feels ok Some pelvic pain No vomiting Breathing ok  Physical Exam: Vitals:   07/22/22 0131 07/22/22 0534 07/22/22 0920 07/22/22 1326  BP: (!) 106/57 (!) 95/59 98/63 112/73  Pulse: 73 86 72 81  Resp: '18 18 16 16  '$ Temp: 99.1 F (37.3 C) 98.6 F (37 C) 98.1 F (36.7 C) 98.7 F (37.1 C)  TempSrc: Oral Oral Oral Oral  SpO2: 96% 94% 94% 99%   Physical Exam Vitals reviewed.  Constitutional:      General: She is not in acute distress.    Appearance: She is not ill-appearing or toxic-appearing.  Cardiovascular:     Rate and Rhythm: Normal rate and regular rhythm.     Heart sounds: No murmur heard. Abdominal:     General: There is no distension.  Neurological:     Mental Status: She is alert.  Psychiatric:        Mood and Affect: Mood normal.        Behavior: Behavior normal.     Data Reviewed:  WBC 11.2, improved  Family Communication: none  Disposition: Status is:  Inpatient Remains inpatient appropriate because: diverticulitis, pelvic abscess  Planned Discharge Destination: Home    Time spent: 35 minutes  Author: Murray Hodgkins, MD 07/22/2022 8:19 PM  For on call review [email or web address].

## 2022-07-22 NOTE — ED Provider Notes (Signed)
[facility] Provider Note  CSN: [id number] Arrival date & time: 07/21/22 1710  Chief Complaint(s) Abdominal Pain  HPI Lisa Price is a 66 y.o. female who presents emergency department for evaluation of abdominal pain.  Patient has been previously evaluated by her primary care physician and found to have uncomplicated diverticulitis 2 weeks ago where she was placed on Augmentin.  She states that her symptoms briefly improved but have since returned in a worse fashion and the patient had a CT scan performed today that showed acute diverticulitis with a pelvic abscess.  Patient then transferred to the emergency department for likely hospital admission.  On arrival, patient states that her pain is mostly controlled but she does endorse nausea.  She denies chest pain, shortness of breath, headache, fever or other systemic symptoms.   Past Medical History Past Medical History:  Diagnosis Date   Anxiety    Depression    Family history of breast cancer 12/25/2020   Family history of thyroid cancer 12/25/2020   Hiatal hernia    History of radiation therapy 02/24/21-03/25/21   Left breast- Dr. Gery Pray   Personal history of radiation therapy    Sleep apnea    Patient Active Problem List   Diagnosis Date Noted   Acute diverticulitis 07/21/2022   Genetic testing 01/03/2021   Family history of breast cancer 12/25/2020   Family history of thyroid cancer 12/25/2020   Malignant neoplasm of upper-outer quadrant of left breast in female, estrogen receptor positive ([place]) 12/24/2020   Home Medication(s) Prior to Admission medications   Medication Sig Start Date End Date Taking? Authorizing Provider  clonazePAM (KLONOPIN) 1 MG tablet Take 1 mg by mouth See admin instructions. Take 1 mg by mouth two to three times a days as needed for anxiety 07/16/18  Yes [provider]  desvenlafaxine (PRISTIQ) 100 MG 24 hr tablet Take 100 mg by mouth in the morning.   Yes [provider]  traZODone (DESYREL) 100 MG tablet Take 300 mg by mouth at bedtime. 11/08/20  Yes [provider]  VYVANSE 70 MG capsule Take 70 mg by mouth in the morning.   Yes [provider]  anastrozole (ARIMIDEX) 1 MG tablet Take 1 tablet (1 mg total) by mouth daily. Patient not taking: Reported on 07/21/2022 04/06/21   Nicholas Lose, MD                                                                                                                                    Past Surgical History Past Surgical History:  Procedure Laterality Date   ABDOMINAL HYSTERECTOMY     BREAST BIOPSY Left 12/19/2020   BREAST LUMPECTOMY Left 01/2021   BREAST LUMPECTOMY WITH RADIOACTIVE SEED AND SENTINEL LYMPH NODE BIOPSY Left 01/10/2021   Procedure: LEFT BREAST LUMPECTOMY WITH RADIOACTIVE SEED AND SENTINEL LYMPH NODE BIOPSY;  Surgeon: Coralie Keens, MD;  Location: [facility];  Service: General;  Laterality: Left;   Family History Family History  Problem Relation Age of Onset   Melanoma Mother    Thyroid cancer Father        unknown age diagnosed   Breast cancer Paternal Grandmother        dx before age 92   Thyroid cancer Maternal Grandmother        dx after age 60    Social History Social History   Tobacco Use   Smoking status: Never   Smokeless tobacco: Never  Substance Use Topics   Alcohol use: No   Drug use: No   Allergies Patient has no known allergies.  Review of Systems Review of Systems  Gastrointestinal:  Positive for abdominal pain and nausea.    Physical Exam Vital Signs  I have reviewed the triage vital signs BP 112/77 (BP Location: Right Arm)   Pulse 74   Temp 97.9 F (36.6 C) (Oral)   Resp 18   SpO2 99%   Physical Exam Vitals and nursing note reviewed.  Constitutional:      General: She is not in acute distress.    Appearance: She is well-developed.  HENT:     Head: Normocephalic and atraumatic.  Eyes:     Conjunctiva/sclera:  Conjunctivae normal.  Cardiovascular:     Rate and Rhythm: Normal rate and regular rhythm.     Heart sounds: No murmur heard. Pulmonary:     Effort: Pulmonary effort is normal. No respiratory distress.     Breath sounds: Normal breath sounds.  Abdominal:     Palpations: Abdomen is soft.     Tenderness: There is abdominal tenderness in the left lower quadrant.  Musculoskeletal:        General: No swelling.     Cervical back: Neck supple.  Skin:    General: Skin is warm and dry.     Capillary Refill: Capillary refill takes less than 2 seconds.  Neurological:     Mental Status: She is alert.  Psychiatric:        Mood and Affect: Mood normal.     ED Results and Treatments Labs (all labs ordered are listed, but only abnormal results are displayed) Labs Reviewed  CBC WITH DIFFERENTIAL/PLATELET - Abnormal; Notable for the following components:      Result Value   WBC 14.0 (*)    Neutro Abs 10.7 (*)    Monocytes Absolute 1.3 (*)    All other components within normal limits  COMPREHENSIVE METABOLIC PANEL - Abnormal; Notable for the following components:   Glucose, Bld 103 (*)    All other components within normal limits  URINALYSIS, ROUTINE W REFLEX MICROSCOPIC - Abnormal; Notable for the following components:   Specific Gravity, Urine <1.005 (*)    Hgb urine dipstick LARGE (*)    Protein, ur TRACE (*)    Leukocytes,Ua MODERATE (*)    All other components within normal limits  URINALYSIS, MICROSCOPIC (REFLEX) - Abnormal; Notable for the following components:   Bacteria, UA RARE (*)    All other components within normal limits  LIPASE, BLOOD  CBC  Radiology CT ABDOMEN PELVIS W CONTRAST  Result Date: 07/21/2022 CLINICAL DATA:  Left lower quadrant pain EXAM: CT ABDOMEN AND PELVIS WITH CONTRAST TECHNIQUE: Multidetector CT imaging of the abdomen and pelvis was  performed using the standard protocol following bolus administration of intravenous contrast. RADIATION DOSE REDUCTION: This exam was performed according to the departmental dose-optimization program which includes automated exposure control, adjustment of the mA and/or kV according to patient size and/or use of iterative reconstruction technique. CONTRAST:  1104m ISOVUE-300 IOPAMIDOL (ISOVUE-300) INJECTION 61% COMPARISON:  CT abdomen and pelvis dated June 24, 2022 FINDINGS: Lower chest: Small hiatal hernia.  No acute abnormality. Hepatobiliary: No focal liver abnormality is seen. No gallstones, gallbladder wall thickening, or biliary dilatation. Pancreas: Unremarkable. No pancreatic ductal dilatation or surrounding inflammatory changes. Spleen: Normal in size without focal abnormality. Adrenals/Urinary Tract: Adrenal glands are unremarkable. Kidneys are normal, without renal calculi, or hydronephrosis. Exophytic simple cyst of the lower pole of the left kidney, no further follow-up imaging is needed. Wall thickening of the left dome of the bladder. Stomach/Bowel: Marked wall thickening of the sigmoid colon with adjacent inflammatory change and inflamed diverticula and increased pelvic inflammatory change. New pelvic fluid collection measuring 2.5 x 2.5 cm on series 2, image 69 located adjacent to the sigmoid colon, small bowel loop, left dome of the bladder, and left vaginal cuff. Cecum and proximal transverse colon which is partially positioned in the pelvis. New wall thickening of the portion of the proximal ascending colon and multiple loops of distal small bowel are located in the pelvis, likely reactive. Normal appendix. No evidence of obstruction. Vascular/Lymphatic: No significant vascular findings are present. No enlarged abdominal or pelvic lymph nodes. Reproductive: No adnexal masses. Other: No abdominal wall hernia or abnormality. No abdominopelvic ascites. No extraluminal gas. Musculoskeletal: No acute  or significant osseous findings. IMPRESSION: 1. Complicated acute sigmoid diverticulitis with increased pelvic inflammatory change and new small pelvic abscess located adjacent to the sigmoid colon, a small bowel loop, left dome of the bladder, and left vaginal cuff. 2. New wall thickening of a portion of the proximal ascending colon and multiple loops of distal small bowel which are located in the pelvis and increased wall thickening of the left bladder wall, likely reactive. 3. Recommend follow-up colonoscopy or surgical correlation to assess for underlying neoplasm given large degree of sigmoid wall thickening. 4. Aortic Atherosclerosis (ICD10-I70.0). Critical Value/emergent results were called by telephone at the time of interpretation on 07/21/2022 at 1:50 pm AFonnie Birkenhead MA for provider SRochele Raring, who verbally acknowledged these results. Electronically Signed   By: LYetta GlassmanM.D.   On: 07/21/2022 13:56    Pertinent labs & imaging results that were available during my care of the patient were reviewed by me and considered in my medical decision making (see MDM for details).  Medications Ordered in ED Medications  metroNIDAZOLE (FLAGYL) IVPB 500 mg (500 mg Intravenous New Bag/Given 07/21/22 1943)  0.9 %  sodium chloride infusion ( Intravenous New Bag/Given 07/21/22 2302)  venlafaxine XR (EFFEXOR-XR) 24 hr capsule 150 mg (has no administration in time range)  traZODone (DESYREL) tablet 300 mg (300 mg Oral Given 07/21/22 2325)  clonazePAM (KLONOPIN) tablet 1 mg (has no administration in time range)  lisdexamfetamine (VYVANSE) capsule 70 mg (has no administration in time range)  cefTRIAXone (ROCEPHIN) 2 g in sodium chloride 0.9 % 100 mL IVPB (has no administration in time range)  traZODone (DESYREL) 50 MG tablet (has no administration in time range)  cefTRIAXone (  ROCEPHIN) 1 g in sodium chloride 0.9 % 100 mL IVPB (1 g Intravenous New Bag/Given 07/21/22 1945)                                                                                                                                      Procedures Procedures  (including critical care time)  Medical Decision Making / ED Course   This patient presents to the ED for concern of abdominal pain, abnormal CT, this involves an extensive number of treatment options, and is a complaint that carries with it a high risk of complications and morbidity.  The differential diagnosis includes diverticulitis, pelvic abscess, cystitis  MDM: Patient seen emergency room for evaluation of abdominal pain and abnormal CT.  Physical exam with left lower quadrant tenderness to palpation but is otherwise unremarkable.  Laboratory evaluation with a leukocytosis to 14, chemistry unremarkable, urinalysis with moderate leuk esterase, 6-10 red blood cells, 11-20 white blood cells and rare bacteria.  I spoke briefly with the general surgeon on-call Dr. Kae Heller states that the patient does not need emergent surgery at this time and may require an IR consultation but can be admitted to medicine.  Patient started on ceftriaxone and Flagyl and admitted to medicine.   Additional history obtained: -Additional history obtained from husband -External records from outside source obtained and reviewed including: Chart review including previous notes, labs, imaging, consultation notes   Lab Tests: -I ordered, reviewed, and interpreted labs.   The pertinent results include:   Labs Reviewed  CBC WITH DIFFERENTIAL/PLATELET - Abnormal; Notable for the following components:      Result Value   WBC 14.0 (*)    Neutro Abs 10.7 (*)    Monocytes Absolute 1.3 (*)    All other components within normal limits  COMPREHENSIVE METABOLIC PANEL - Abnormal; Notable for the following components:   Glucose, Bld 103 (*)    All other components within normal limits  URINALYSIS, ROUTINE W REFLEX MICROSCOPIC - Abnormal; Notable for the following components:   Specific Gravity, Urine <1.005  (*)    Hgb urine dipstick LARGE (*)    Protein, ur TRACE (*)    Leukocytes,Ua MODERATE (*)    All other components within normal limits  URINALYSIS, MICROSCOPIC (REFLEX) - Abnormal; Notable for the following components:   Bacteria, UA RARE (*)    All other components within normal limits  LIPASE, BLOOD  CBC    Medicines ordered and prescription drug management: Meds ordered this encounter  Medications   cefTRIAXone (ROCEPHIN) 1 g in sodium chloride 0.9 % 100 mL IVPB    Order Specific Question:   Antibiotic Indication:    Answer:   Intra-abdominal   metroNIDAZOLE (FLAGYL) IVPB 500 mg    Order Specific Question:   Antibiotic Indication:    Answer:   Intra-abdominal Infection   0.9 %  sodium chloride infusion   venlafaxine XR (EFFEXOR-XR) 24 hr  capsule 150 mg   traZODone (DESYREL) tablet 300 mg   clonazePAM (KLONOPIN) tablet 1 mg    Take 1 mg by mouth two to three times a days as needed for anxiety     lisdexamfetamine (VYVANSE) capsule 70 mg   cefTRIAXone (ROCEPHIN) 2 g in sodium chloride 0.9 % 100 mL IVPB    Order Specific Question:   Antibiotic Indication:    Answer:   Intra-abdominal   traZODone (DESYREL) 50 MG tablet    Huertas, Shalom G: cabinet override    -I have reviewed the patients home medicines and have made adjustments as needed  Critical interventions none  Consultations Obtained: I requested consultation with the general surgeon Dr. Kae Heller,  and discussed lab and imaging findings as well as pertinent plan - they recommend: Medical admission and IR consultation   Cardiac Monitoring: The patient was maintained on a cardiac monitor.  I personally viewed and interpreted the cardiac monitored which showed an underlying rhythm of: NSR  Social Determinants of Health:  Factors impacting patients care include: none   Reevaluation: After the interventions noted above, I reevaluated the patient and found that they have :improved  Co morbidities that complicate  the patient evaluation  Past Medical History:  Diagnosis Date   Anxiety    Depression    Family history of breast cancer 12/25/2020   Family history of thyroid cancer 12/25/2020   Hiatal hernia    History of radiation therapy 02/24/21-03/25/21   Left breast- Dr. Gery Pray   Personal history of radiation therapy    Sleep apnea       Dispostion: I considered admission for this patient, and due to diverticulitis with pelvic abscess, patient require hospital admission     Final Clinical Impression(s) / ED Diagnoses Final diagnoses:  None     '@PCDICTATION'$ @    Teressa Lower, MD 07/22/22 [phone]

## 2022-07-22 NOTE — Assessment & Plan Note (Signed)
History of left breast cancer s/p lumpectomy and radiation. -Supposed to be on anastrozole but appears to not be taking due to concerns for side effects

## 2022-07-23 DIAGNOSIS — K639 Disease of intestine, unspecified: Secondary | ICD-10-CM | POA: Diagnosis not present

## 2022-07-23 DIAGNOSIS — K5720 Diverticulitis of large intestine with perforation and abscess without bleeding: Secondary | ICD-10-CM | POA: Diagnosis not present

## 2022-07-23 DIAGNOSIS — K5792 Diverticulitis of intestine, part unspecified, without perforation or abscess without bleeding: Secondary | ICD-10-CM | POA: Diagnosis not present

## 2022-07-23 LAB — CBC
HCT: 38.6 % (ref 36.0–46.0)
Hemoglobin: 12.7 g/dL (ref 12.0–15.0)
MCH: 28.4 pg (ref 26.0–34.0)
MCHC: 32.9 g/dL (ref 30.0–36.0)
MCV: 86.4 fL (ref 80.0–100.0)
Platelets: 299 10*3/uL (ref 150–400)
RBC: 4.47 MIL/uL (ref 3.87–5.11)
RDW: 13.3 % (ref 11.5–15.5)
WBC: 8.2 10*3/uL (ref 4.0–10.5)
nRBC: 0 % (ref 0.0–0.2)

## 2022-07-23 NOTE — Progress Notes (Signed)
  Progress Note   Patient: Lisa Price [id number] DOB: [birth year] DOA: 07/21/2022     2 DOS: the patient was seen and examined on 07/23/2022   Brief hospital course: 66 year old woman presented with nausea and abdominal pain.  Admitted for diverticulitis with pelvic abscess.  Seen by interventional radiology and general surgery.  Abscess too small for drain placement.  Empiric antibiotics.  Surgery following.  Improving with conservative management.  Assessment and Plan: * Acute diverticulitis. Diverticulitis with adjacent 2.5 pelvic abscess. - Surgery recommended IR consult to assess for possibility of drainage (too small to drain).  If medical treatment fails she likely require sigmoid colectomy with possible colostomy this admission.  If she does improve, would need colonoscopy to rule out malignancy followed by consultation with colorectal surgery to discuss possible elective sigmoid colectomy. -Continue IV Rocephin and Flagyl.  On discharge can transition to oral cephalosporin and Flagyl.  Colonic thickening --CT A/P showed new wall thickening of the proximal ascending colon and multiple loops of distal small bowel.  There is also large degree of sigmoid wall thickening concerning for malignancy.  Patient eventually will need colonoscopy to further evaluate pending resolution of her diverticulitis.  History of breast cancer History of left breast cancer s/p lumpectomy and radiation. -Supposed to be on anastrozole but appears to not be taking due to concerns for side effects     Subjective:  Feels better Tolerating diet Minimal pain  Physical Exam: Vitals:   07/22/22 2132 07/23/22 0421 07/23/22 0524 07/23/22 1427  BP: 101/61 103/65  (!) 135/94  Pulse: 72 70 75 94  Resp: '14 14  16  '$ Temp: 98.7 F (37.1 C) 98.1 F (36.7 C)  97.7 F (36.5 C)  TempSrc: Oral Oral  Oral  SpO2: 97% 98% 96% 98%   Physical Exam Vitals reviewed.  Constitutional:      General: She is not in acute  distress.    Appearance: She is not ill-appearing or toxic-appearing.     Comments: Sits up in bed spontaneously without apparent difficulty.  Cardiovascular:     Rate and Rhythm: Normal rate and regular rhythm.     Heart sounds: No murmur heard. Pulmonary:     Effort: Pulmonary effort is normal. No respiratory distress.     Breath sounds: No wheezing, rhonchi or rales.  Neurological:     Mental Status: She is alert.  Psychiatric:        Mood and Affect: Mood normal.        Behavior: Behavior normal.   Data Reviewed:  CBC WNL  Family Communication: husband at bedside  Disposition: Status is: Inpatient Remains inpatient appropriate because: diverticulitis   Planned Discharge Destination: Home    Time spent: 25 minutes  Author: Murray Hodgkins, MD 07/23/2022 4:40 PM  For on call review [email or web address].

## 2022-07-23 NOTE — Progress Notes (Signed)
Mobility Specialist - Progress Note    07/23/22 1514  Mobility  HOB Elevated/Bed Position Self regulated  Activity Ambulated independently in hallway  Range of Motion/Exercises Active  Level of Assistance Independent  Assistive Device None  Distance Ambulated (ft) 3000 ft  Activity Response Tolerated well  $Mobility charge 1 Mobility   Pt was found on bed and agreeable to mobilize. Pt had no complaints during ambulation and returned back to room with all necessities in reach. RN notified of session.  Ferd Hibbs Mobility Specialist

## 2022-07-23 NOTE — Progress Notes (Signed)
[facility] Progress Note     Subjective: CC:  Reports her pain as about the same and describes it as discomfort. Reports a soft brown stool this AM. Denies issues with urination and does reports some mild abdominal pain when she urinates. States she lives with her husband who will be coming to hospital today to visit.  Objective: Vital signs in last 24 hours: Temp:  [98.1 F (36.7 C)-98.7 F (37.1 C)] 98.1 F (36.7 C) (08/17 0421) Pulse Rate:  [70-81] 75 (08/17 0524) Resp:  [14-16] 14 (08/17 0421) BP: (101-112)/(61-73) 103/65 (08/17 0421) SpO2:  [96 %-99 %] 96 % (08/17 0524) Last BM Date : 07/22/22  Intake/Output from previous day: 08/16 0701 - 08/17 0700 In: 620.7 [P.O.:120; I.V.:200.7; IV Piggyback:300] Out: 300 [Urine:300] Intake/Output this shift: No intake/output data recorded.  PE: Gen:  Alert, NAD, cooperative Pulm:  Normal effort ORA Abd: Soft, non-distended, mild TTP LLQ and SP regions without rebound tenderness or guarding, no palpable organomegaly.  Skin: warm and dry, no rashes  Psych: A&Ox3   Lab Results:  Recent Labs    07/22/22 0837 07/23/22 0919  WBC 11.2* 8.2  HGB 12.6 12.7  HCT 38.8 38.6  PLT 282 299   BMET Recent Labs    07/21/22 1733  NA 138  K 3.7  CL 105  CO2 26  GLUCOSE 103*  BUN 15  CREATININE 0.66  CALCIUM 9.3   PT/INR Recent Labs    07/22/22 0924  LABPROT 15.6*  INR 1.3*   CMP     Component Value Date/Time   NA 138 07/21/2022 1733   K 3.7 07/21/2022 1733   CL 105 07/21/2022 1733   CO2 26 07/21/2022 1733   GLUCOSE 103 (H) 07/21/2022 1733   BUN 15 07/21/2022 1733   CREATININE 0.66 07/21/2022 1733   CREATININE 0.78 12/25/2020 0832   CALCIUM 9.3 07/21/2022 1733   PROT 7.0 07/21/2022 1733   ALBUMIN 3.8 07/21/2022 1733   AST 15 07/21/2022 1733   AST 12 (L) 12/25/2020 0832   ALT 13 07/21/2022 1733   ALT 12 12/25/2020 0832   ALKPHOS 72 07/21/2022 1733   BILITOT 0.5 07/21/2022 1733   BILITOT 0.3  12/25/2020 0832   GFRNONAA >60 07/21/2022 1733   GFRNONAA >60 12/25/2020 0832   Lipase     Component Value Date/Time   LIPASE 25 07/21/2022 1733       Studies/Results: CT ABDOMEN PELVIS W CONTRAST  Result Date: 07/21/2022 CLINICAL DATA:  Left lower quadrant pain EXAM: CT ABDOMEN AND PELVIS WITH CONTRAST TECHNIQUE: Multidetector CT imaging of the abdomen and pelvis was performed using the standard protocol following bolus administration of intravenous contrast. RADIATION DOSE REDUCTION: This exam was performed according to the departmental dose-optimization program which includes automated exposure control, adjustment of the mA and/or kV according to patient size and/or use of iterative reconstruction technique. CONTRAST:  13m ISOVUE-300 IOPAMIDOL (ISOVUE-300) INJECTION 61% COMPARISON:  CT abdomen and pelvis dated June 24, 2022 FINDINGS: Lower chest: Small hiatal hernia.  No acute abnormality. Hepatobiliary: No focal liver abnormality is seen. No gallstones, gallbladder wall thickening, or biliary dilatation. Pancreas: Unremarkable. No pancreatic ductal dilatation or surrounding inflammatory changes. Spleen: Normal in size without focal abnormality. Adrenals/Urinary Tract: Adrenal glands are unremarkable. Kidneys are normal, without renal calculi, or hydronephrosis. Exophytic simple cyst of the lower pole of the left kidney, no further follow-up imaging is needed. Wall thickening of the left dome of the bladder. Stomach/Bowel: Marked wall thickening of the  sigmoid colon with adjacent inflammatory change and inflamed diverticula and increased pelvic inflammatory change. New pelvic fluid collection measuring 2.5 x 2.5 cm on series 2, image 69 located adjacent to the sigmoid colon, small bowel loop, left dome of the bladder, and left vaginal cuff. Cecum and proximal transverse colon which is partially positioned in the pelvis. New wall thickening of the portion of the proximal ascending colon and  multiple loops of distal small bowel are located in the pelvis, likely reactive. Normal appendix. No evidence of obstruction. Vascular/Lymphatic: No significant vascular findings are present. No enlarged abdominal or pelvic lymph nodes. Reproductive: No adnexal masses. Other: No abdominal wall hernia or abnormality. No abdominopelvic ascites. No extraluminal gas. Musculoskeletal: No acute or significant osseous findings. IMPRESSION: 1. Complicated acute sigmoid diverticulitis with increased pelvic inflammatory change and new small pelvic abscess located adjacent to the sigmoid colon, a small bowel loop, left dome of the bladder, and left vaginal cuff. 2. New wall thickening of a portion of the proximal ascending colon and multiple loops of distal small bowel which are located in the pelvis and increased wall thickening of the left bladder wall, likely reactive. 3. Recommend follow-up colonoscopy or surgical correlation to assess for underlying neoplasm given large degree of sigmoid wall thickening. 4. Aortic Atherosclerosis (ICD10-I70.0). Critical Value/emergent results were called by telephone at the time of interpretation on 07/21/2022 at 1:50 pm Fonnie Birkenhead, MA for provider Rochele Raring , who verbally acknowledged these results. Electronically Signed   By: Yetta Glassman M.D.   On: 07/21/2022 13:56    Anti-infectives: Anti-infectives (From admission, onward)    Start     Dose/Rate Route Frequency Ordered Stop   07/22/22 2000  cefTRIAXone (ROCEPHIN) 2 g in sodium chloride 0.9 % 100 mL IVPB        2 g 200 mL/hr over 30 Minutes Intravenous Every 24 hours 07/22/22 0011     07/21/22 1930  cefTRIAXone (ROCEPHIN) 1 g in sodium chloride 0.9 % 100 mL IVPB        1 g 200 mL/hr over 30 Minutes Intravenous  Once 07/21/22 1919 07/21/22 2015   07/21/22 1930  metroNIDAZOLE (FLAGYL) IVPB 500 mg        500 mg 100 mL/hr over 60 Minutes Intravenous Every 12 hours 07/21/22 1919          Assessment/Plan Acute  diverticulitis with perforation and 2.5 cm abscess  - CT 8/15 also notable for thickening of the ascending colon and bladder, presumed reactive.  - afebrile, VSS, WBC 14 > 11 > 8.2 today.  - continue IV Rocephin/Flagyl - IR reviewed CT 8/16 and felt abscess too small for drainage - advance diet to FLD - [facility] will follow closely with you. She is currently improving with non-operative management. Failure to improve would likely result in sigmoid colectomy with colostomy.  - If she does improve with medical therapy, would recommend short interval colonoscopy to rule out malignancy given the somewhat indolent course of this, followed by consultation with colorectal surgery to discuss possible elective sigmoid colectomy.  I think if she continues to improve discharge home tomorrow on SOFT diet and PO abx is reasonable.   PMH hysterectomy PMH breast cancer s/p lumpectomy and radiation   LOS: 2 days   I reviewed nursing notes, ED provider notes, hospitalist notes, last 24 h vitals and pain scores, last 48 h intake and output, last 24 h labs and trends, and last 24 h imaging results.    Obie Dredge,  PA-C [facility] Please see Amion for pager number during day hours 7:00am-4:30pm

## 2022-07-24 DIAGNOSIS — K5720 Diverticulitis of large intestine with perforation and abscess without bleeding: Secondary | ICD-10-CM | POA: Diagnosis not present

## 2022-07-24 DIAGNOSIS — K639 Disease of intestine, unspecified: Secondary | ICD-10-CM | POA: Diagnosis not present

## 2022-07-24 MED ORDER — CEFDINIR 300 MG PO CAPS
300.0000 mg | ORAL_CAPSULE | Freq: Two times a day (BID) | ORAL | Status: DC
  Administered 2022-07-24: 300 mg via ORAL
  Filled 2022-07-24: qty 1

## 2022-07-24 MED ORDER — METRONIDAZOLE 500 MG PO TABS: 500.0000 mg | ORAL_TABLET | Freq: Three times a day (TID) | ORAL | 0 refills | Status: AC

## 2022-07-24 MED ORDER — CEFDINIR 300 MG PO CAPS: 300.0000 mg | ORAL_CAPSULE | Freq: Two times a day (BID) | ORAL | 0 refills | Status: DC

## 2022-07-24 MED ORDER — LISDEXAMFETAMINE DIMESYLATE 50 MG PO CAPS
50.0000 mg | ORAL_CAPSULE | Freq: Every day | ORAL | Status: DC
  Administered 2022-07-24: 50 mg via ORAL
  Filled 2022-07-24: qty 1

## 2022-07-24 MED ORDER — LISDEXAMFETAMINE DIMESYLATE 20 MG PO CAPS
20.0000 mg | ORAL_CAPSULE | Freq: Every day | ORAL | Status: DC
  Administered 2022-07-24: 20 mg via ORAL
  Filled 2022-07-24: qty 1

## 2022-07-24 MED ORDER — LISDEXAMFETAMINE DIMESYLATE 70 MG PO CAPS: 70.0000 mg | ORAL_CAPSULE | Freq: Every day | ORAL | Status: DC

## 2022-07-24 NOTE — Discharge Summary (Signed)
Physician Discharge Summary   Patient: Lisa Price MRN: [id number] DOB: 07-12-1956  Admit date:     07/21/2022  Discharge date: 07/24/22  Discharge Physician: Murray Hodgkins   PCP: Charlane Ferretti, MD   Recommendations at discharge:   Resolution of acute diverticulitis   Colonic thickening --CT A/P showed new wall thickening of the proximal ascending colon and multiple loops of distal small bowel.  There is also large degree of sigmoid wall thickening concerning for malignancy. --Suggest referral to GI for colonoscopy colonoscopy to further evaluate pending resolution of her diverticulitis.  Discharge Diagnoses: Principal Problem:   Diverticulitis of intestine with abscess Active Problems:   Colonic thickening   History of breast cancer  Resolved Problems:   * No resolved hospital problems. *  Hospital Course: 66 year old woman presented with nausea and abdominal pain.  Admitted for diverticulitis with pelvic abscess.  Seen by interventional radiology and general surgery.  Abscess too small for drain placement.  Treated with empiric antibiotics and followed by general surgery.  Responded well to conservative management.  Discharged home in good condition.  Follow-up with general surgery and gastroenterology as an outpatient.  * Acute diverticulitis. Diverticulitis with adjacent 2.5 pelvic abscess. - Surgery recommended IR consult to assess for possibility of drainage (too small to drain).   --Gradually improved on empiric antibiotics and discharged home in good condition on oral cephalosporin and Flagyl.   Colonic thickening --CT A/P showed new wall thickening of the proximal ascending colon and multiple loops of distal small bowel.  There is also large degree of sigmoid wall thickening concerning for malignancy. --Suggest referral to GI for colonoscopy colonoscopy to further evaluate pending resolution of her diverticulitis.   History of breast cancer History of left breast  cancer s/p lumpectomy and radiation. -Supposed to be on anastrozole but appears to not be taking due to concerns for side effects       Consultants:  General surgery IR  Procedures performed:  None   Disposition: Home Diet recommendation:  Soft diet  DISCHARGE MEDICATION: Allergies as of 07/24/2022   No Known Allergies      Medication List     STOP taking these medications    anastrozole 1 MG tablet Commonly known as: ARIMIDEX       TAKE these medications    cefdinir 300 MG capsule Commonly known as: OMNICEF Take 1 capsule (300 mg total) by mouth every 12 (twelve) hours.   clonazePAM 1 MG tablet Commonly known as: KLONOPIN Take 1 mg by mouth See admin instructions. Take 1 mg by mouth two to three times a days as needed for anxiety   desvenlafaxine 100 MG 24 hr tablet Commonly known as: PRISTIQ Take 100 mg by mouth in the morning.   metroNIDAZOLE 500 MG tablet Commonly known as: Flagyl Take 1 tablet (500 mg total) by mouth 3 (three) times daily for 11 days.   traZODone 100 MG tablet Commonly known as: DESYREL Take 300 mg by mouth at bedtime.   Vyvanse 70 MG capsule Generic drug: lisdexamfetamine Take 70 mg by mouth in the morning.        Follow-up Information     Ileana Roup, MD. Go on 09/07/2022.   Specialties: General Surgery, Colon and Rectal Surgery Why: follow up after colonoscopy to discuss possible surgery for diverticulitis. Appointment time 1030 am. please arrive 30 minutes early to complete check in process and bring photo ID and insurance card if you have them Contact information: [address] [address] [phone]         Charlane Ferretti, MD. Schedule an appointment as soon as possible for a visit in 1 week(s).   Specialty: Internal Medicine Contact information: [address] [place] [address] [phone]                Feels better Discharge Exam: Filed Weights   07/24/22  0854  Weight: 82.3 kg   Physical Exam Vitals reviewed.  Constitutional:      General: She is not in acute distress.    Appearance: She is not ill-appearing or toxic-appearing.  Cardiovascular:     Rate and Rhythm: Normal rate and regular rhythm.     Heart sounds: No murmur heard. Pulmonary:     Effort: Pulmonary effort is normal. No respiratory distress.     Breath sounds: No wheezing, rhonchi or rales.  Neurological:     Mental Status: She is alert.  Psychiatric:        Mood and Affect: Mood normal.        Behavior: Behavior normal.    Condition at discharge: good  The results of significant diagnostics from this hospitalization (including imaging, microbiology, ancillary and laboratory) are listed below for reference.   Imaging Studies: CT ABDOMEN PELVIS W CONTRAST  Result Date: 07/21/2022 CLINICAL DATA:  Left lower quadrant pain EXAM: CT ABDOMEN AND PELVIS WITH CONTRAST TECHNIQUE: Multidetector CT imaging of the abdomen and pelvis was performed using the standard protocol following bolus administration of intravenous contrast. RADIATION DOSE REDUCTION: This exam was performed according to the departmental dose-optimization program which includes automated exposure control, adjustment of the mA and/or kV according to patient size and/or use of iterative reconstruction technique. CONTRAST:  134m ISOVUE-300 IOPAMIDOL (ISOVUE-300) INJECTION 61% COMPARISON:  CT abdomen and pelvis dated June 24, 2022 FINDINGS: Lower chest: Small hiatal hernia.  No acute abnormality. Hepatobiliary: No focal liver abnormality is seen. No gallstones, gallbladder wall thickening, or biliary dilatation. Pancreas: Unremarkable. No pancreatic ductal dilatation or surrounding inflammatory changes. Spleen: Normal in size without focal abnormality. Adrenals/Urinary Tract: Adrenal glands are unremarkable. Kidneys are normal, without renal calculi, or hydronephrosis. Exophytic simple cyst of the lower pole of the left  kidney, no further follow-up imaging is needed. Wall thickening of the left dome of the bladder. Stomach/Bowel: Marked wall thickening of the sigmoid colon with adjacent inflammatory change and inflamed diverticula and increased pelvic inflammatory change. New pelvic fluid collection measuring 2.5 x 2.5 cm on series 2, image 69 located adjacent to the sigmoid colon, small bowel loop, left dome of the bladder, and left vaginal cuff. Cecum and proximal transverse colon which is partially positioned in the pelvis. New wall thickening of the portion of the proximal ascending colon and multiple loops of distal small bowel are located in the pelvis, likely reactive. Normal appendix. No evidence of obstruction. Vascular/Lymphatic: No significant vascular findings are present. No enlarged abdominal or pelvic lymph nodes. Reproductive: No adnexal masses. Other: No abdominal wall hernia or abnormality. No abdominopelvic ascites. No extraluminal gas. Musculoskeletal: No acute or significant osseous findings. IMPRESSION: 1. Complicated acute sigmoid diverticulitis with increased pelvic inflammatory change and new small pelvic abscess located adjacent to the sigmoid colon, a small bowel loop, left dome of the bladder, and left vaginal cuff. 2. New wall thickening of a portion of the proximal ascending colon and multiple loops of distal small bowel which are located in the pelvis and increased wall thickening of the left  bladder wall, likely reactive. 3. Recommend follow-up colonoscopy or surgical correlation to assess for underlying neoplasm given large degree of sigmoid wall thickening. 4. Aortic Atherosclerosis (ICD10-I70.0). Critical Value/emergent results were called by telephone at the time of interpretation on 07/21/2022 at 1:50 pm Fonnie Birkenhead, MA for provider Rochele Raring , who verbally acknowledged these results. Electronically Signed   By: Yetta Glassman M.D.   On: 07/21/2022 13:56    Microbiology: Results for orders  placed or performed in visit on 06/12/21  Urine Culture     Status: Abnormal   Collection Time: 06/12/21 10:56 AM   Specimen: Urine, Clean Catch  Result Value Ref Range Status   Specimen Description   Final    URINE, CLEAN CATCH Performed at [facility], [place] [address], [place], [place] [address]    Special Requests   Final    NONE Performed at [facility], [place] [address], [place], [place] [address]    Culture (A)  Final    <10,000 COLONIES/mL INSIGNIFICANT GROWTH Performed at [facility] [address] [place], [place] [address]    Report Status 06/13/2021 FINAL  Final    Labs: CBC: Recent Labs  Lab 07/21/22 1733 07/22/22 0837 07/23/22 0919  WBC 14.0* 11.2* 8.2  NEUTROABS 10.7*  --   --   HGB 13.8 12.6 12.7  HCT 41.9 38.8 38.6  MCV 85.2 86.2 86.4  PLT 347 282 697   Basic Metabolic Panel: Recent Labs  Lab 07/21/22 1733  NA 138  K 3.7  CL 105  CO2 26  GLUCOSE 103*  BUN 15  CREATININE 0.66  CALCIUM 9.3   Liver Function Tests: Recent Labs  Lab 07/21/22 1733  AST 15  ALT 13  ALKPHOS 72  BILITOT 0.5  PROT 7.0  ALBUMIN 3.8   CBG: No results for input(s): "GLUCAP" in the last 168 hours.  Discharge time spent: less than 30 minutes.  Signed: Murray Hodgkins, MD [facility] 07/24/2022

## 2022-07-24 NOTE — Progress Notes (Signed)
[facility] Progress Note     Subjective: CC:  Reports her pain as about the same and describes it as discomfort. Reports a soft brown stool this AM. Denies issues with urination and does reports some mild abdominal pain when she urinates. States she lives with her husband who will be coming to hospital today to visit.  Objective: Vital signs in last 24 hours: Temp:  [97.7 F (36.5 C)-98.3 F (36.8 C)] 98.3 F (36.8 C) (08/18 0714) Pulse Rate:  [69-94] 69 (08/18 0714) Resp:  [14-16] 16 (08/18 0714) BP: (110-135)/(73-94) 110/73 (08/18 0714) SpO2:  [97 %-100 %] 97 % (08/18 0714) Weight:  [82.3 kg] 82.3 kg (08/18 0854) Last BM Date : 07/22/22  Intake/Output from previous day: No intake/output data recorded. Intake/Output this shift: No intake/output data recorded.  PE: Gen:  Alert, NAD, cooperative Pulm:  Normal effort ORA Abd: Soft, non-distended, mild TTP LLQ and SP regions without rebound tenderness or guarding, no palpable organomegaly.  Skin: warm and dry, no rashes  Psych: A&Ox3   Lab Results:  Recent Labs    07/22/22 0837 07/23/22 0919  WBC 11.2* 8.2  HGB 12.6 12.7  HCT 38.8 38.6  PLT 282 299   BMET Recent Labs    07/21/22 1733  NA 138  K 3.7  CL 105  CO2 26  GLUCOSE 103*  BUN 15  CREATININE 0.66  CALCIUM 9.3   PT/INR Recent Labs    07/22/22 0924  LABPROT 15.6*  INR 1.3*   CMP     Component Value Date/Time   NA 138 07/21/2022 1733   K 3.7 07/21/2022 1733   CL 105 07/21/2022 1733   CO2 26 07/21/2022 1733   GLUCOSE 103 (H) 07/21/2022 1733   BUN 15 07/21/2022 1733   CREATININE 0.66 07/21/2022 1733   CREATININE 0.78 12/25/2020 0832   CALCIUM 9.3 07/21/2022 1733   PROT 7.0 07/21/2022 1733   ALBUMIN 3.8 07/21/2022 1733   AST 15 07/21/2022 1733   AST 12 (L) 12/25/2020 0832   ALT 13 07/21/2022 1733   ALT 12 12/25/2020 0832   ALKPHOS 72 07/21/2022 1733   BILITOT 0.5 07/21/2022 1733   BILITOT 0.3 12/25/2020 0832   GFRNONAA >60  07/21/2022 1733   GFRNONAA >60 12/25/2020 0832   Lipase     Component Value Date/Time   LIPASE 25 07/21/2022 1733       Studies/Results: No results found.  Anti-infectives: Anti-infectives (From admission, onward)    Start     Dose/Rate Route Frequency Ordered Stop   07/24/22 1000  cefdinir (OMNICEF) capsule 300 mg        300 mg Oral Every 12 hours 07/24/22 0906     07/22/22 2000  cefTRIAXone (ROCEPHIN) 2 g in sodium chloride 0.9 % 100 mL IVPB  Status:  Discontinued        2 g 200 mL/hr over 30 Minutes Intravenous Every 24 hours 07/22/22 0011 07/24/22 0906   07/21/22 1930  cefTRIAXone (ROCEPHIN) 1 g in sodium chloride 0.9 % 100 mL IVPB        1 g 200 mL/hr over 30 Minutes Intravenous  Once 07/21/22 1919 07/21/22 2015   07/21/22 1930  metroNIDAZOLE (FLAGYL) IVPB 500 mg        500 mg 100 mL/hr over 60 Minutes Intravenous Every 12 hours 07/21/22 1919          Assessment/Plan Acute diverticulitis with perforation and 2.5 cm abscess  - CT 8/15 also notable for thickening  of the ascending colon and bladder, presumed reactive.  - afebrile, VSS, WBC 14 > 11 > 8.2 yesterday.  - IR reviewed CT 8/16 and felt abscess too small for drainage - advance diet to Soft - She is improving with non-op mgmt. Recommend short interval colonoscopy to rule out malignancy given the somewhat indolent course of this, followed by consultation with colorectal surgery to discuss possible elective sigmoid colectomy.  stable for discharge home from a [facility] perspective. continue SOFT diet and PO abx (would use cipro/flagyl since she has already taken two courses of augmentin PTA) to complete 2 week course. Needs to see GI for colonoscopy and then we have arranged follow up in our office as well.  PMH hysterectomy PMH breast cancer s/p lumpectomy and radiation   LOS: 3 days   I reviewed nursing notes, ED provider notes, hospitalist notes, last 24 h vitals and pain scores, last 48 h intake and output, last  24 h labs and trends, and last 24 h imaging results.    Obie Dredge, PA-C [facility] Please see Amion for pager number during day hours 7:00am-4:30pm

## 2022-08-03 ENCOUNTER — Telehealth: Payer: Self-pay | Admitting: Internal Medicine

## 2022-08-03 NOTE — Telephone Encounter (Signed)
Good Afternoon Dr. Hilarie Fredrickson,   Supervising MD for 8/11 PM   We have received a referral for patient to  be seen for  GI and Motility Disorders ll long-standing IBS-C despite dietary Changes. Patient also had records sent from her last GI provider with [facility]. Patient had a colonoscopy and endoscopy in December of 2019. Patient had last office visit with [facility] in January of 2021. Patient is wanting to transfer care due to our office being closer to her. I will be sending records for you yo review and also records are in epic as well. Will you please review and advise on scheduling?   Thank you.

## 2022-08-14 NOTE — Telephone Encounter (Signed)
Patient called to follow up on transfer of care request.

## 2022-08-16 NOTE — Telephone Encounter (Signed)
Due to demand I cannot accept new pt at this time. A [facility] provider with more availability due to younger overall practice may be able to accommodate but I cannot speak for them Would forward to another [facility] provider with less tenure though answer may be the same

## 2022-08-17 ENCOUNTER — Ambulatory Visit: Payer: Medicare PPO

## 2022-08-18 NOTE — Telephone Encounter (Signed)
Hi Dr Candis Schatz, Would you be ok with accepting this request for Transfer of care, please advise

## 2022-08-21 ENCOUNTER — Encounter: Payer: Self-pay | Admitting: Gastroenterology

## 2022-08-21 IMAGING — MG MM PLC BREAST LOC DEV 1ST LESION INC MAMMO GUIDE*L*
8 series · 8 of 16 positions shown · non-contrast
Comparison: Previous exam(s).

CLINICAL DATA: Pre lumpectomy localization of recently diagnosed
invasive ductal carcinoma and ductal carcinoma in situ in the
upper-outer quadrant of the left breast at the location of
stereotactic guided biopsied distortion, marked with a coil shaped
biopsy marker clip. There was 5 mm of lateral and 9 mm of inferior
migration of the coil shaped biopsy marker clip following the
biopsy.

EXAM:
MAMMOGRAPHIC GUIDED RADIOACTIVE SEED LOCALIZATION OF THE LEFT BREAST

[L LM (1 of 2)]
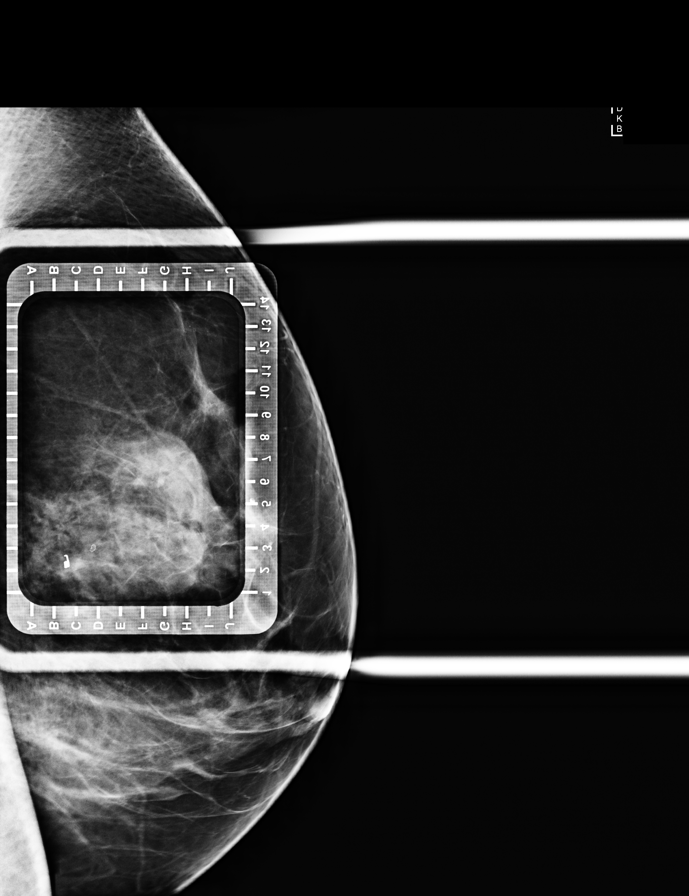

[L CC (1 of 2)]
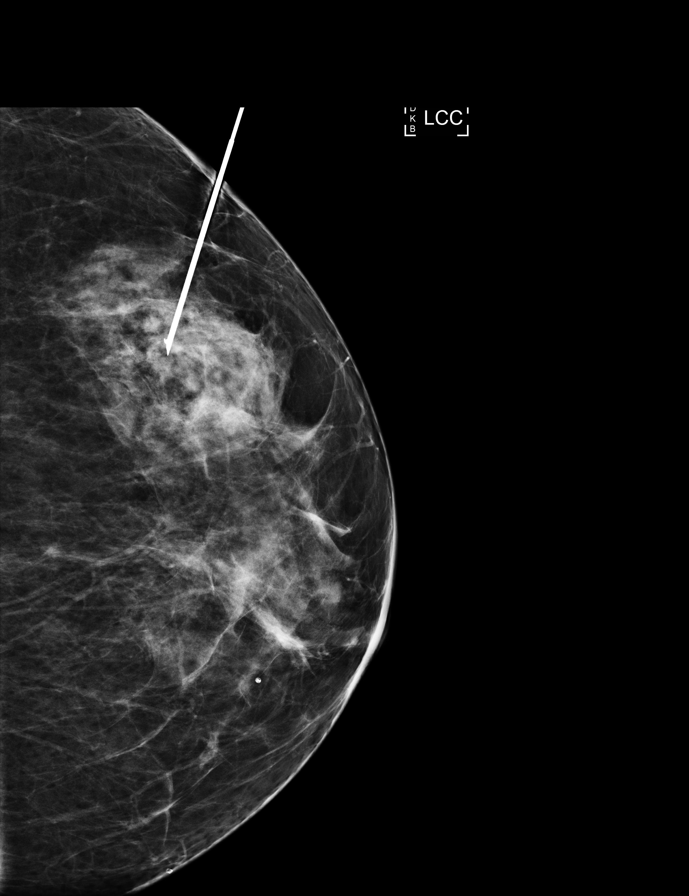

[L CC (2 of 2)]
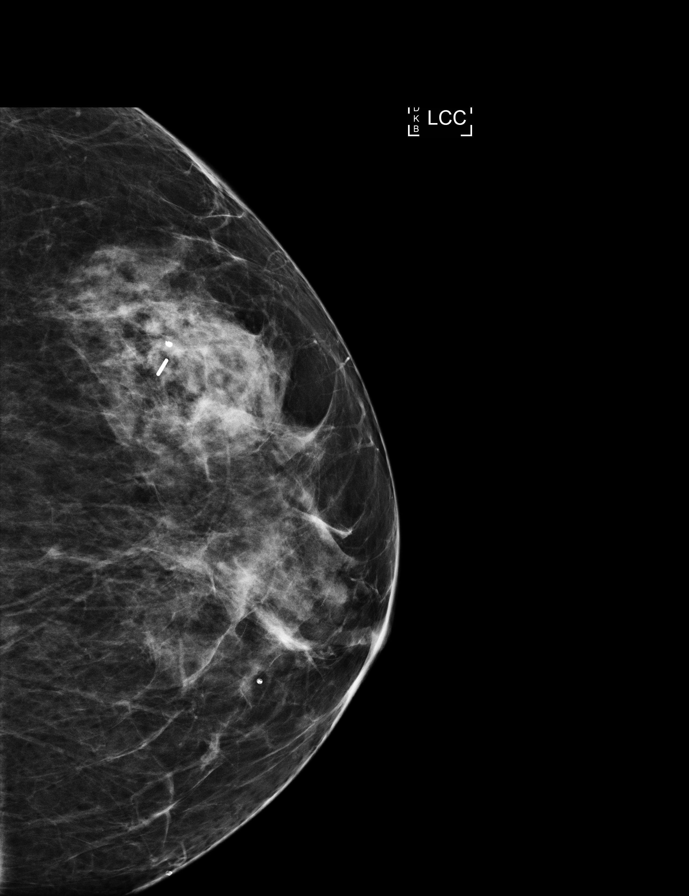

[L LM (2 of 2)]
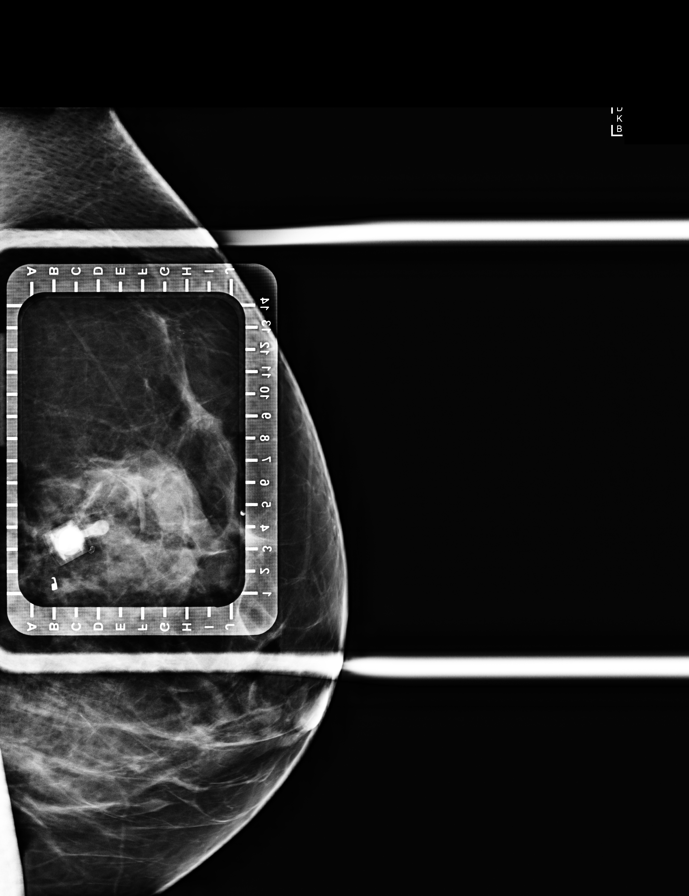

[L CC synth-2D]
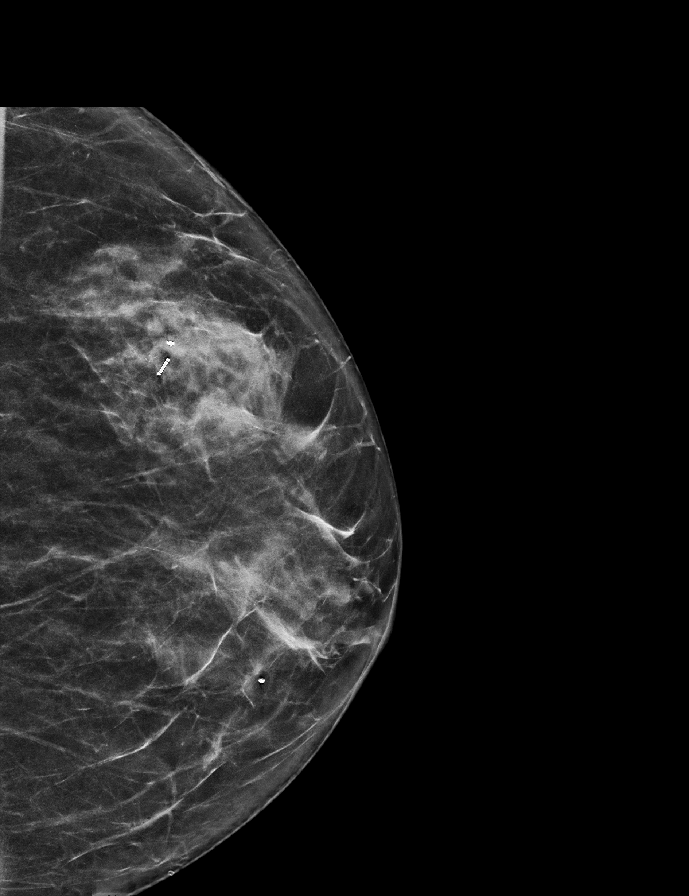

[L ML synth-2D]
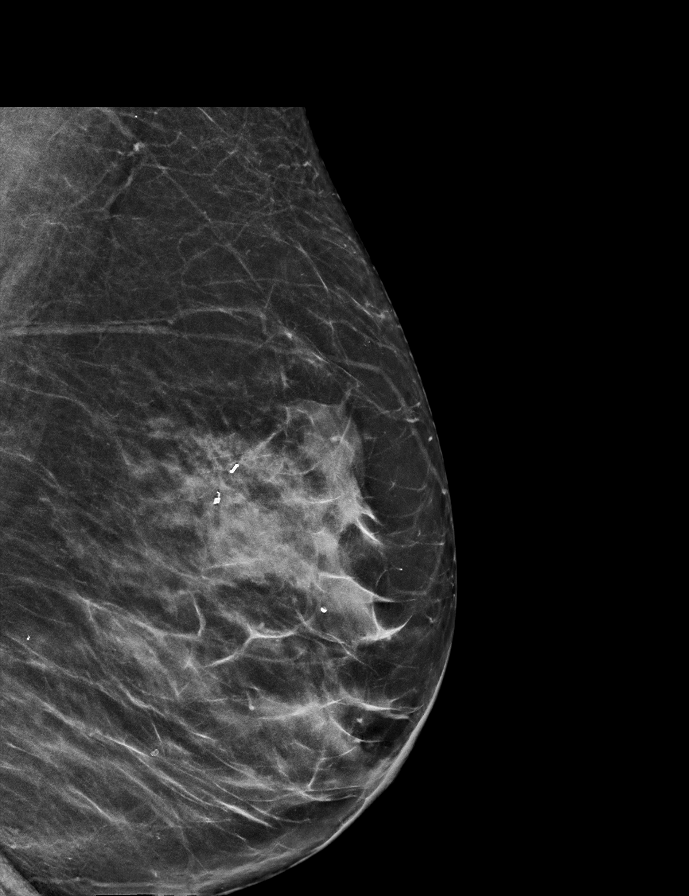

[L ML tomo · tomo slice 33/64.0]
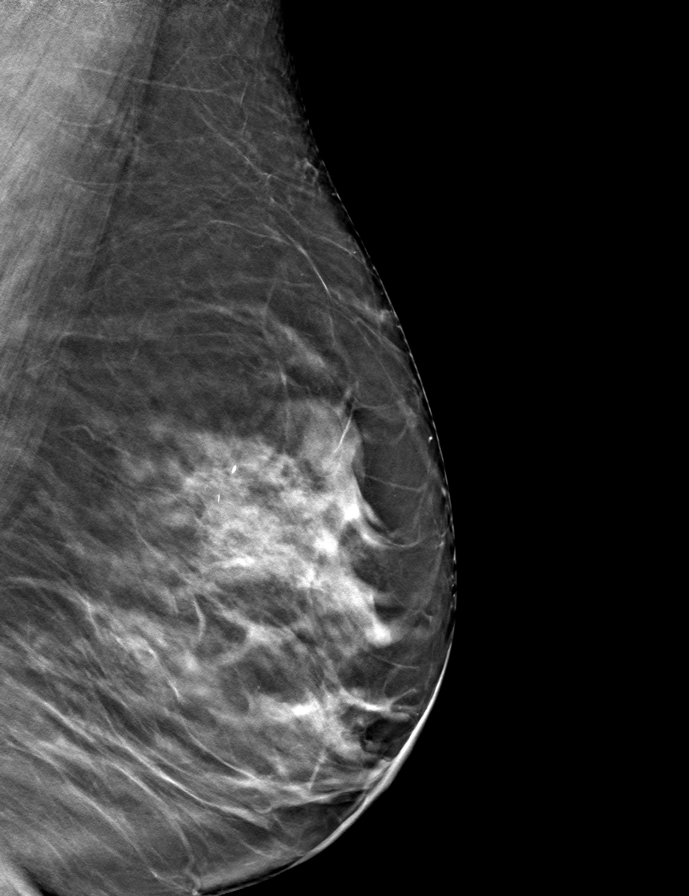

[L CC tomo · tomo slice 32/63.0]
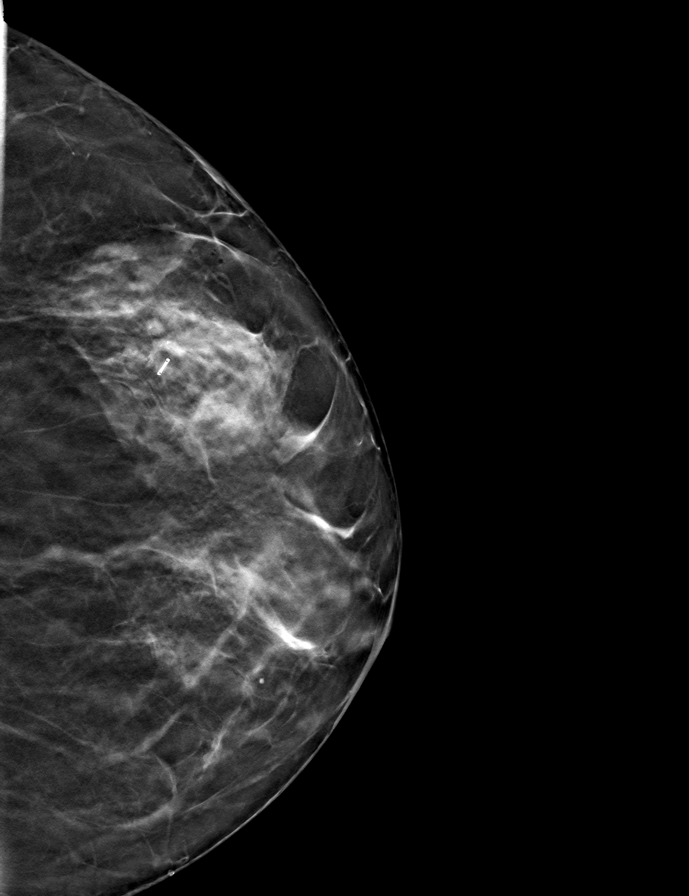

[8 of 16 positions shown; findings below may reference images not displayed]

FINDINGS: Patient presents for radioactive seed localization prior to left
lumpectomy. I met with the patient and we discussed the procedure of
seed localization including benefits and alternatives. We discussed
the high likelihood of a successful procedure. We discussed the
risks of the procedure including infection, bleeding, tissue injury
and further surgery. We discussed the low dose of radioactivity
involved in the procedure. Informed, written consent was given.

The usual time-out protocol was performed immediately prior to the
procedure.

Using mammographic guidance, sterile technique, 1% lidocaine and an
H-WIK radioactive seed, the center of the recently biopsied
distortion in the upper outer left breast was localized using a
lateral approach. The follow-up mammogram images confirm the seed in
the expected location and were marked for Dr. Shabana.

Follow-up survey of the patient confirms presence of the radioactive
seed.

Order number of H-WIK seed:  [id number].

Total activity:  0.236 mCi reference Date: 12/26/2020

The patient tolerated the procedure well and was released from the
[REDACTED]. She was given instructions regarding seed removal.
IMPRESSION: Radioactive seed localization left breast. No apparent
complications.

## 2022-08-21 NOTE — Telephone Encounter (Signed)
Hello Dr. Candis Schatz,    Patient is scheduled for 10/18.      Thank you

## 2022-09-23 ENCOUNTER — Encounter: Payer: Self-pay | Admitting: Gastroenterology

## 2022-09-23 ENCOUNTER — Ambulatory Visit: Payer: Medicare PPO | Admitting: Gastroenterology

## 2022-09-23 VITALS — BP 132/74 | HR 107 | Ht 65.0 in | Wt 180.0 lb

## 2022-09-23 DIAGNOSIS — K5792 Diverticulitis of intestine, part unspecified, without perforation or abscess without bleeding: Secondary | ICD-10-CM

## 2022-09-23 DIAGNOSIS — K581 Irritable bowel syndrome with constipation: Secondary | ICD-10-CM | POA: Diagnosis not present

## 2022-09-23 MED ORDER — ONDANSETRON HCL 4 MG PO TABS: 4.0000 mg | ORAL_TABLET | Freq: Four times a day (QID) | ORAL | 0 refills | Status: DC | PRN

## 2022-09-23 MED ORDER — SUTAB 1479-225-188 MG PO TABS: 1.0000 | ORAL_TABLET | Freq: Once | ORAL | 0 refills | Status: AC

## 2022-09-23 MED ORDER — DICYCLOMINE HCL 20 MG PO TABS: 20.0000 mg | ORAL_TABLET | Freq: Four times a day (QID) | ORAL | 1 refills | Status: DC | PRN

## 2022-09-23 NOTE — Progress Notes (Signed)
HPI : Lisa Price is a very pleasant 66 year old female with history of anxiety, depression, sleep apnea and breast cancer who is referred to [place] by Dr. Rochele Raring for follow-up of diverticulitis.  The patient was admitted to the hospital in August of this year with diverticulitis, with a small pelvic abscess.  She did not require drainage and was treated with antibiotics alone.  She was seen by Dr. Nadeen Landau and is tentatively planning for elective sigmoidectomy to reduce risk of recurrent complicated diverticulitis. The patient has longstanding issues with abdominal pain, nausea and constipation.  She was previously diagnosed with irritable bowel syndrome and SIBO.  She was followed by [facility] gastroenterology previously and underwent an EGD and colonoscopy in 2019.  I am not able to see the actual procedure reports, but I cannot see the pathology reports.  Biopsies of the colon and ileum were unremarkable.  It does not appear any polyps were removed.  Gastric and duodenal biopsies were unremarkable. She thinks she had a colonoscopy with Dr. Collene Mares prior to this, but does not recall what year.  Currently, she still struggles with nausea, abdominal pain and constipation most days.  She has been bowel movements regularly, but her stools are usually hard, small unsatisfactory.  She often has straining with bowel movements.  Rarely does she have diarrhea.  She is currently not taking any medications regularly to help with her symptoms.  She takes milk of magnesia on occasion which does seem to help with constipation.  She recalls taking Benefiber or Metamucil in the past, but does not remember if it helped much.  She has taken senna, but finds this to be too harsh.  She denies any blood in the stool.  No family history of colon cancer.  No unintentional weight loss.     Past Medical History:  Diagnosis Date   Acid reflux    Anxiety    Depression    Family history of breast cancer 12/25/2020    Family history of thyroid cancer 12/25/2020   GERD (gastroesophageal reflux disease)    Hiatal hernia    History of radiation therapy 02/24/21-03/25/21   Left breast- Dr. Gery Pray   Personal history of radiation therapy    Sleep apnea      Past Surgical History:  Procedure Laterality Date   ABDOMINAL HYSTERECTOMY     BREAST BIOPSY Left 12/19/2020   BREAST LUMPECTOMY Left 01/2021   BREAST LUMPECTOMY WITH RADIOACTIVE SEED AND SENTINEL LYMPH NODE BIOPSY Left 01/10/2021   Procedure: LEFT BREAST LUMPECTOMY WITH RADIOACTIVE SEED AND SENTINEL LYMPH NODE BIOPSY;  Surgeon: Coralie Keens, MD;  Location: [place];  Service: General;  Laterality: Left;   Family History  Problem Relation Age of Onset   Melanoma Mother    Thyroid cancer Father        unknown age diagnosed   Breast cancer Paternal Grandmother        dx before age 13   Thyroid cancer Maternal Grandmother        dx after age 32   Social History   Tobacco Use   Smoking status: Never   Smokeless tobacco: Never  Substance Use Topics   Alcohol use: No   Drug use: No   Current Outpatient Medications  Medication Sig Dispense Refill   cefdinir (OMNICEF) 300 MG capsule Take 1 capsule (300 mg total) by mouth every 12 (twelve) hours. 22 capsule 0   clonazePAM (KLONOPIN) 1 MG tablet Take 1 mg by  mouth See admin instructions. Take 1 mg by mouth two to three times a days as needed for anxiety     desvenlafaxine (PRISTIQ) 100 MG 24 hr tablet Take 100 mg by mouth in the morning.     traZODone (DESYREL) 100 MG tablet Take 300 mg by mouth at bedtime.     VYVANSE 70 MG capsule Take 70 mg by mouth in the morning.     No current facility-administered medications for this visit.   No Known Allergies   Review of Systems: All systems reviewed and negative except where noted in HPI.    No results found.  Physical Exam: BP 132/74   Pulse (!) 107   Ht '5\' 5"'$  (1.651 m)   Wt 180 lb (81.6 kg)   BMI 29.95 kg/m   Constitutional: Pleasant,well-developed, Caucasian female in no acute distress.  Accompanied by husband HEENT: Normocephalic and atraumatic. Conjunctivae are normal. No scleral icterus.  MP3, small oral airway Neck supple.  Cardiovascular: Normal rate, regular rhythm.  Pulmonary/chest: Effort normal and breath sounds normal. No wheezing, rales or rhonchi. Abdominal: Soft, nondistended, mild diffuse tenderness to palpation without rigidity or guarding. Bowel sounds active throughout. There are no masses palpable. No hepatomegaly. Extremities: no edema Neurological: Alert and oriented to person place and time. Skin: Skin is warm and dry. No rashes noted. Psychiatric: Normal mood and affect. Behavior is normal.  CBC    Component Value Date/Time   WBC 8.2 07/23/2022 0919   RBC 4.47 07/23/2022 0919   HGB 12.7 07/23/2022 0919   HGB 13.7 12/25/2020 0832   HCT 38.6 07/23/2022 0919   PLT 299 07/23/2022 0919   PLT 352 12/25/2020 0832   MCV 86.4 07/23/2022 0919   MCH 28.4 07/23/2022 0919   MCHC 32.9 07/23/2022 0919   RDW 13.3 07/23/2022 0919   LYMPHSABS 1.8 07/21/2022 1733   MONOABS 1.3 (H) 07/21/2022 1733   EOSABS 0.2 07/21/2022 1733   BASOSABS 0.1 07/21/2022 1733    CMP     Component Value Date/Time   NA 138 07/21/2022 1733   K 3.7 07/21/2022 1733   CL 105 07/21/2022 1733   CO2 26 07/21/2022 1733   GLUCOSE 103 (H) 07/21/2022 1733   BUN 15 07/21/2022 1733   CREATININE 0.66 07/21/2022 1733   CREATININE 0.78 12/25/2020 0832   CALCIUM 9.3 07/21/2022 1733   PROT 7.0 07/21/2022 1733   ALBUMIN 3.8 07/21/2022 1733   AST 15 07/21/2022 1733   AST 12 (L) 12/25/2020 0832   ALT 13 07/21/2022 1733   ALT 12 12/25/2020 0832   ALKPHOS 72 07/21/2022 1733   BILITOT 0.5 07/21/2022 1733   BILITOT 0.3 12/25/2020 0832   GFRNONAA >60 07/21/2022 1733   GFRNONAA >60 12/25/2020 9470   Clinical Data: Abdominal pain and nausea.   ABDOMEN CT WITHOUT AND WITH CONTRAST:  Technique: Multidetector CT  imaging of the abdomen was performed both before and during bolus administration of intravenous contrast.  Contrast: 100 cc Omnipaque 300  Comparison: Ultrasound dated 03/18/06 and hepatobiliary scan dated 03/18/06.   Findings: There is no pleural or pericardial effusion.   Mild dependent changes are seen at the posterior lung bases bilaterally.   No focal pulmonary nodules or masses are noted.   On the precontrast series there is no evidence for nephrolithiasis. No abnormal calcifications are seen within the pancreatic parenchyma. The appendix is noted to be normal. No abnormal calcifications are seen within the urinary bladder.   Liver is normal in attenuation and  morphology.   The gallbladder is negative.   The pancreas is negative.   The spleen is negative.   Both adrenal glands are negative.   There is a low density nodule arising from the lower pole of the left kidney consistent with a simple cyst. The left kidney is otherwise normal. The right kidney is normal. On the delayed images there is symmetric excretion of intravenous contrast material from both kidneys. There is no retroperitoneal adenopathy. The visualized bowel loops are normal in course and caliber without evidence for bowel obstruction. No retroperitoneal or small bowel mesenteric adenopathy is noted.   Small hiatal hernia.   Review of bone windows is unremarkable.   IMPRESSION:  1. No findings to explain the patient's right flank pain and nausea.   2. No evidence for nephrolithiasis.   PELVIS CT WITH AND WITHOUT CONTRAST:   Technique: Multidetector CT imaging of the pelvis was performed both before and during bolus administration of intravenous contrast.  Findings: The pelvic bowel loops are negative.   There is trace free fluid. The uterus and the adnexal structures appear normal. There is no adenopathy.   Bone windows are negative.   IMPRESSION:  No acute pelvic CT findings and no findings to explain the patient's pain.    Provider: Reesa Chew   Narrative & Impression CLINICAL DATA:  66 year old female with generalized abdominal pain for 1 month   EXAM: CT ABDOMEN AND PELVIS WITH CONTRAST   TECHNIQUE: Multidetector CT imaging of the abdomen and pelvis was performed using the standard protocol following bolus administration of intravenous contrast.   RADIATION DOSE REDUCTION: This exam was performed according to the departmental dose-optimization program which includes automated exposure control, adjustment of the mA and/or kV according to patient size and/or use of iterative reconstruction technique.   CONTRAST:  170m ISOVUE-300 IOPAMIDOL (ISOVUE-300) INJECTION 61%   COMPARISON:  CT 11/26/2006   FINDINGS: Lower chest: No acute abnormality.   Hepatobiliary: No focal liver abnormality is seen. No gallstones, gallbladder wall thickening, or biliary dilatation.   Pancreas: Unremarkable. No pancreatic ductal dilatation or surrounding inflammatory changes.   Spleen: Normal in size without focal abnormality.   Adrenals/Urinary Tract: Adrenal glands are unremarkable. Low-density nodule arising from the lower pole of the left kidney consistent with a simple cyst. No urinary calculi or hydronephrosis.   Stomach/Bowel: Colonic diverticulosis with wall thickening and adjacent stranding about the sigmoid colon compatible with diverticulitis. Small amount of adjacent free fluid. No drainable fluid collection or abscess. Normal appendix. Normal caliber of the large and small bowel.   Vascular/Lymphatic: Normal caliber abdominal aorta. No suspicious abdominal or pelvic lymphadenopathy.   Reproductive: Status post hysterectomy. No adnexal masses.   Other: No free intraperitoneal air.   Musculoskeletal: No acute or significant osseous findings.   IMPRESSION: Acute sigmoid diverticulitis without drainable fluid collection. Follow-up colonoscopy is recommended when clinically appropriate  to rule out underlying mass.     Electronically Signed   By: TPlacido SouM.D.   On: 06/24/2022 10:52  Narrative & Impression CLINICAL DATA:  Left lower quadrant pain   EXAM: CT ABDOMEN AND PELVIS WITH CONTRAST   TECHNIQUE: Multidetector CT imaging of the abdomen and pelvis was performed using the standard protocol following bolus administration of intravenous contrast.   RADIATION DOSE REDUCTION: This exam was performed according to the departmental dose-optimization program which includes automated exposure control, adjustment of the mA and/or kV according to patient size and/or use of iterative reconstruction technique.   CONTRAST:  155m ISOVUE-300 IOPAMIDOL (ISOVUE-300) INJECTION 61%   COMPARISON:  CT abdomen and pelvis dated June 24, 2022   FINDINGS: Lower chest: Small hiatal hernia.  No acute abnormality.   Hepatobiliary: No focal liver abnormality is seen. No gallstones, gallbladder wall thickening, or biliary dilatation.   Pancreas: Unremarkable. No pancreatic ductal dilatation or surrounding inflammatory changes.   Spleen: Normal in size without focal abnormality.   Adrenals/Urinary Tract: Adrenal glands are unremarkable. Kidneys are normal, without renal calculi, or hydronephrosis. Exophytic simple cyst of the lower pole of the left kidney, no further follow-up imaging is needed. Wall thickening of the left dome of the bladder.   Stomach/Bowel: Marked wall thickening of the sigmoid colon with adjacent inflammatory change and inflamed diverticula and increased pelvic inflammatory change. New pelvic fluid collection measuring 2.5 x 2.5 cm on series 2, image 69 located adjacent to the sigmoid colon, small bowel loop, left dome of the bladder, and left vaginal cuff. Cecum and proximal transverse colon which is partially positioned in the pelvis. New wall thickening of the portion of the proximal ascending colon and multiple loops of distal small bowel are  located in the pelvis, likely reactive. Normal appendix. No evidence of obstruction.   Vascular/Lymphatic: No significant vascular findings are present. No enlarged abdominal or pelvic lymph nodes.   Reproductive: No adnexal masses.   Other: No abdominal wall hernia or abnormality. No abdominopelvic ascites. No extraluminal gas.   Musculoskeletal: No acute or significant osseous findings.   IMPRESSION: 1. Complicated acute sigmoid diverticulitis with increased pelvic inflammatory change and new small pelvic abscess located adjacent to the sigmoid colon, a small bowel loop, left dome of the bladder, and left vaginal cuff. 2. New wall thickening of a portion of the proximal ascending colon and multiple loops of distal small bowel which are located in the pelvis and increased wall thickening of the left bladder wall, likely reactive. 3. Recommend follow-up colonoscopy or surgical correlation to assess for underlying neoplasm given large degree of sigmoid wall thickening. 4. Aortic Atherosclerosis (ICD10-I70.0).   Critical Value/emergent results were called by telephone at the time of interpretation on 07/21/2022 at 1:50 pm AFonnie Birkenhead MA for provider SRochele Raring, who verbally acknowledged these results.     Electronically Signed   By: LYetta GlassmanM.D.   On: 07/21/2022 13:56     ASSESSMENT AND PLAN: 66year old female with chronic symptoms of abdominal pain, constipation and nausea, most consistent with constipation predominant IBS, also with recent admission for complicated sigmoid diverticulitis.  Agree with plans for elective sigmoidectomy to reduce risk of further complicated diverticulitis.  Although she has had a colonoscopy in the last 5 years, repeating a colonoscopy prior to surgery is reasonable.  We will schedule patient for colonoscopy prior to potential sigmoidectomy. With regards to the patient's constipation and abdominal pain, recommend the patient start just  with daily Metamucil at least once daily to improve her stool bulk and consistency.  We will also give Bentyl to be taken as needed for abdominal pain episodes.  We can escalate her bowel regiment as needed, but I think starting with fiber would help with her constipation as well as reduce risk of diverticular complications in the meantime. The patient reports having significant nausea with previous bowel prep.  Therefore, we will prescribe Zofran to be taken as needed during her bowel prep.  The patient denies problems with inadequate bowel preps.  Complicated sigmoid diverticulitis - Colonoscopy prior to potential sigmoidectomy; plan for pediatric colonoscope -  Zofran for nausea with bowel prep  IBS C - Metamucil daily - Bentyl as needed   The details, risks (including bleeding, perforation, infection, missed lesions, medication reactions and possible hospitalization or surgery if complications occur), benefits, and alternatives to colonoscopy with possible biopsy and possible polypectomy were discussed with the patient and she consents to proceed.    Lisa Price E. Candis Schatz, MD [facility]   CC: Charlane Ferretti, MD

## 2022-09-23 NOTE — Patient Instructions (Signed)
_______________________________________________________  If you are age 67 or older, your body mass index should be between 23-30. Your Body mass index is 29.95 kg/m. If this is out of the aforementioned range listed, please consider follow up with your Primary Care Provider.  If you are age 19 or younger, your body mass index should be between 19-25. Your Body mass index is 29.95 kg/m. If this is out of the aformentioned range listed, please consider follow up with your Primary Care Provider.   We have sent the following medications to your pharmacy for you to pick up at your convenience: Zofran 4 mg every 6 hours as needed. Bentyl 20 mg every 6 hours as needed for pain.  You have been scheduled for a colonoscopy. Please follow written instructions given to you at your visit today.  Please pick up your prep supplies at the pharmacy within the next 1-3 days. If you use inhalers (even only as needed), please bring them with you on the day of your procedure.  Please purchase Metamucil over the counter. Take as directed.   The [facility] providers would like to encourage you to use [facility] to communicate with providers for non-urgent requests or questions.  Due to long hold times on the telephone, sending your provider a message by [facility] may be a faster and more efficient way to get a response.  Please allow 48 business hours for a response.  Please remember that this is for non-urgent requests.   It was a pleasure to see you today!  Thank you for trusting me with your gastrointestinal care!    Scott E.Candis Schatz, MD

## 2022-09-28 ENCOUNTER — Ambulatory Visit: Payer: Medicare PPO | Attending: Surgery

## 2022-09-28 VITALS — Wt 180.4 lb

## 2022-09-28 DIAGNOSIS — Z483 Aftercare following surgery for neoplasm: Secondary | ICD-10-CM | POA: Insufficient documentation

## 2022-09-28 NOTE — Therapy (Signed)
  OUTPATIENT PHYSICAL THERAPY SOZO SCREENING NOTE   Patient Name: Lisa Price MRN: [id number] DOB:[birth year], 66 y.o., female Today's Date: 09/28/2022  PCP: Charlane Ferretti, MD REFERRING PROVIDER: Coralie Keens, MD   PT End of Session - 09/28/22 1038     Visit Number 1   # unchanged due to screen only   PT Start Time 34    PT Stop Time 1044    PT Time Calculation (min) 9 min    Activity Tolerance Patient tolerated treatment well    Behavior During Therapy [facility] for tasks assessed/performed             Past Medical History:  Diagnosis Date   Acid reflux    Anxiety    Depression    Family history of breast cancer 12/25/2020   Family history of thyroid cancer 12/25/2020   GERD (gastroesophageal reflux disease)    Hiatal hernia    History of radiation therapy 02/24/21-03/25/21   Left breast- Dr. Gery Pray   Personal history of radiation therapy    Sleep apnea    Past Surgical History:  Procedure Laterality Date   ABDOMINAL HYSTERECTOMY     BREAST BIOPSY Left 12/19/2020   BREAST LUMPECTOMY Left 01/2021   BREAST LUMPECTOMY WITH RADIOACTIVE SEED AND SENTINEL LYMPH NODE BIOPSY Left 01/10/2021   Procedure: LEFT BREAST LUMPECTOMY WITH RADIOACTIVE SEED AND SENTINEL LYMPH NODE BIOPSY;  Surgeon: Coralie Keens, MD;  Location: [place];  Service: General;  Laterality: Left;   Patient Active Problem List   Diagnosis Date Noted   Colonic thickening 07/22/2022   History of breast cancer 07/22/2022   Diverticulitis of intestine with abscess 07/21/2022   Genetic testing 01/03/2021   Family history of breast cancer 12/25/2020   Family history of thyroid cancer 12/25/2020   Malignant neoplasm of upper-outer quadrant of left breast in female, estrogen receptor positive ([place]) 12/24/2020    REFERRING DIAG: left breast cancer at risk for lymphedema  THERAPY DIAG: Aftercare following surgery for neoplasm  PERTINENT HISTORY: Patient was diagnosed on  11/20/2021 with left invasive ductal carcinoma breast cancer.Patient underwent left lumpectomy and sentinel node biopsy (2 negative nodes) on 01/10/2021. It is ER/PR positive and HER2 is pending. Her Ki67 is 3%. She has chronic stomach pain which interferes with daily activities and is also limited by depression and anxiety  PRECAUTIONS: left UE Lymphedema risk, None  SUBJECTIVE: Pt returns for her 3 month L-Dex screen.   PAIN:  Are you having pain? No  SOZO SCREENING: Patient was assessed today using the SOZO machine to determine the lymphedema index score. This was compared to her baseline score. It was determined that she is within the recommended range when compared to her baseline and no further action is needed at this time. She will continue SOZO screenings. These are done every 3 months for 2 years post operatively followed by every 6 months for 2 years, and then annually.   L-DEX FLOWSHEETS - 09/28/22 1000       L-DEX LYMPHEDEMA SCREENING   Measurement Type Unilateral    L-DEX MEASUREMENT EXTREMITY Upper Extremity    POSITION  Standing    DOMINANT SIDE Right    At Risk Side Left    BASELINE SCORE (UNILATERAL) -2.6    L-DEX SCORE (UNILATERAL) -1.7    VALUE CHANGE (UNILAT) 0.9              [name], PTA 09/28/2022, 10:44 AM

## 2022-10-15 ENCOUNTER — Encounter: Payer: Self-pay | Admitting: Gastroenterology

## 2022-10-15 ENCOUNTER — Ambulatory Visit (AMBULATORY_SURGERY_CENTER): Payer: Medicare PPO | Admitting: Gastroenterology

## 2022-10-15 VITALS — BP 135/73 | HR 62 | Temp 97.4°F | Resp 15 | Ht 65.0 in | Wt 180.0 lb

## 2022-10-15 DIAGNOSIS — R1084 Generalized abdominal pain: Secondary | ICD-10-CM

## 2022-10-15 DIAGNOSIS — K5792 Diverticulitis of intestine, part unspecified, without perforation or abscess without bleeding: Secondary | ICD-10-CM | POA: Diagnosis not present

## 2022-10-15 DIAGNOSIS — K5732 Diverticulitis of large intestine without perforation or abscess without bleeding: Secondary | ICD-10-CM | POA: Diagnosis not present

## 2022-10-15 MED ORDER — SODIUM CHLORIDE 0.9 % IV SOLN: 500.0000 mL | Freq: Once | INTRAVENOUS | Status: DC

## 2022-10-15 NOTE — Progress Notes (Signed)
History and Physical Interval Note:  10/15/2022 8:26 AM  Lisa Price  has presented today for endoscopic procedure(s), with the diagnosis of  Encounter Diagnosis  Name Primary?   Diverticulitis Yes  .  The various methods of evaluation and treatment have been discussed with the patient and/or family. After consideration of risks, benefits and other options for treatment, the patient has consented to  the endoscopic procedure(s).   The patient's history has been reviewed, patient examined, no change in status, stable for endoscopic procedure(s).  I have reviewed the patient's chart and labs.  Questions were answered to the patient's satisfaction.     [name] E. Candis Schatz, MD [facility]

## 2022-10-15 NOTE — Progress Notes (Signed)
Pt back in procedure room for a decompression.  Plan to try unsedated.  No new consent needed per [place]

## 2022-10-15 NOTE — Progress Notes (Signed)
Patient was complaining of pain 10/10 and unable to release a lot of gas.  Dr. Candis Schatz took patient back to procedure room to remove any trapped air.  Patient is feeling much better now and stated she's ready for discharge.  No further questions.

## 2022-10-15 NOTE — Progress Notes (Signed)
Report to PACU, RN, vss, BBS= Clear.  

## 2022-10-15 NOTE — Op Note (Addendum)
[facility] Patient Name: Lisa Price Procedure Date: 10/15/2022 8:20 AM MRN: [id number] Endoscopist: Nicki Reaper E. Candis Schatz , MD, [id number] Age: 66 Referring MD:  Date of Birth: [birth year] Gender: Female Account #: [id number] Procedure:                Colonoscopy Indications:              Follow-up of diverticulitis Medicines:                Monitored Anesthesia Care Procedure:                Pre-Anesthesia Assessment:                           - Prior to the procedure, a History and Physical                            was performed, and patient medications and                            allergies were reviewed. The patient's tolerance of                            previous anesthesia was also reviewed. The risks                            and benefits of the procedure and the sedation                            options and risks were discussed with the patient.                            All questions were answered, and informed consent                            was obtained. Prior Anticoagulants: The patient has                            taken no anticoagulant or antiplatelet agents. ASA                            Grade Assessment: II - A patient with mild systemic                            disease. After reviewing the risks and benefits,                            the patient was deemed in satisfactory condition to                            undergo the procedure.                           After obtaining informed consent, the colonoscope  was passed under direct vision. Throughout the                            procedure, the patient's blood pressure, pulse, and                            oxygen saturations were monitored continuously. The                            Olympus PCF-H190DL ([id number]) Colonoscope was                            introduced through the anus and advanced to the the                            terminal ileum, with  identification of the                            appendiceal orifice and IC valve. The colonoscopy                            was somewhat difficult due to multiple diverticula                            in the colon and bowel stenosis. The patient                            tolerated the procedure well. The quality of the                            bowel preparation was good. The terminal ileum,                            ileocecal valve, appendiceal orifice, and rectum                            were photographed. The Endoscope was introduced                            through the anus and advanced to the. Scope In: 8:37:17 AM Scope Out: 8:57:37 AM Scope Withdrawal Time: 0 hours 12 minutes 50 seconds  Total Procedure Duration: 0 hours 20 minutes 20 seconds  Findings:                 The perianal and digital rectal examinations were                            normal. Pertinent negatives include normal                            sphincter tone and no palpable rectal lesions.                           Multiple medium-mouthed and small-mouthed  diverticula were found in the sigmoid colon and                            descending colon. There was narrowing of the colon                            in association with the diverticular opening.                            Biopsies were taken with a cold forceps for                            histology. Estimated blood loss was minimal.                           The exam was otherwise normal throughout the                            examined colon.                           The terminal ileum appeared normal.                           The retroflexed view of the distal rectum and anal                            verge was normal and showed no anal or rectal                            abnormalities. Complications:            No immediate complications. Estimated Blood Loss:     Estimated blood loss was  minimal. Impression:               - Severe diverticulosis in the sigmoid colon and in                            the descending colon. There was substantial                            narrowing of the colon in association with the                            diverticular disease in the sigmoid colon. No mass                            lesion or overt stricture. Biopsied to exclude                            dysplasia.                           - The examined portion of the ileum was normal.                           -  The distal rectum and anal verge are normal on                            retroflexion view. Recommendation:           - Patient has a contact number available for                            emergencies. The signs and symptoms of potential                            delayed complications were discussed with the                            patient. Return to normal activities tomorrow.                            Written discharge instructions were provided to the                            patient.                           - Resume previous diet.                           - Continue present medications.                           - Await pathology results.                           - Repeat colonoscopy in 10 years for screening                            purposes.                           - Continue daily fiber supplementation                           - Follow with Dr. Dema Severin for sigmoid resection. Slayter Moorhouse E. Candis Schatz, MD 10/15/2022 9:09:49 AM This report has been signed electronically.

## 2022-10-15 NOTE — Patient Instructions (Addendum)
-   Patient has a contact number available for emergencies. The signs and symptoms of potential delayed complications were discussed with the patient. Return to normal activities tomorrow. Written discharge instructions were provided to the patient. - Resume previous diet. - Continue present medications. - Await pathology results. - Repeat colonoscopy in 10 years for screening purposes. - Continue daily fiber supplementation - Follow with Dr. Dema Severin for sigmoid resection.  YOU HAD AN ENDOSCOPIC PROCEDURE TODAY AT [facility]:   Refer to the procedure report that was given to you for any specific questions about what was found during the examination.  If the procedure report does not answer your questions, please call your gastroenterologist to clarify.  If you requested that your care partner not be given the details of your procedure findings, then the procedure report has been included in a sealed envelope for you to review at your convenience later.  YOU SHOULD EXPECT: Some feelings of bloating in the abdomen. Passage of more gas than usual.  Walking can help get rid of the air that was put into your GI tract during the procedure and reduce the bloating. If you had a lower endoscopy (such as a colonoscopy or flexible sigmoidoscopy) you may notice spotting of blood in your stool or on the toilet paper. If you underwent a bowel prep for your procedure, you may not have a normal bowel movement for a few days.  Please Note:  You might notice some irritation and congestion in your nose or some drainage.  This is from the oxygen used during your procedure.  There is no need for concern and it should clear up in a day or so.  SYMPTOMS TO REPORT IMMEDIATELY:  Following lower endoscopy (colonoscopy or flexible sigmoidoscopy):  Excessive amounts of blood in the stool  Significant tenderness or worsening of abdominal pains  Swelling of the abdomen that is new, acute  Fever of 100F or  higher  For urgent or emergent issues, a gastroenterologist can be reached at any hour by calling [phone]. Do not use MyChart messaging for urgent concerns.    DIET:  We do recommend a small meal at first, but then you may proceed to your regular diet.  Drink plenty of fluids but you should avoid alcoholic beverages for 24 hours.  ACTIVITY:  You should plan to take it easy for the rest of today and you should NOT DRIVE or use heavy machinery until tomorrow (because of the sedation medicines used during the test).    FOLLOW UP: Our staff will call the number listed on your records the next business day following your procedure.  We will call around 7:15- 8:00 am to check on you and address any questions or concerns that you may have regarding the information given to you following your procedure. If we do not reach you, we will leave a message.     If any biopsies were taken you will be contacted by phone or by letter within the next 1-3 weeks.  Please call us at [phone] if you have not heard about the biopsies in 3 weeks.    SIGNATURES/CONFIDENTIALITY: You and/or your care partner have signed paperwork which will be entered into your electronic medical record.  These signatures attest to the fact that that the information above on your After Visit Summary has been reviewed and is understood.  Full responsibility of the confidentiality of this discharge information lies with you and/or your care-partner.

## 2022-10-15 NOTE — Progress Notes (Signed)
Called to room to assist during endoscopic procedure.  Patient ID and intended procedure confirmed with present staff. Received instructions for my participation in the procedure from the performing physician.Called to room to assist during endoscopic procedure.  Patient ID and intended procedure confirmed with present staff. Received instructions for my participation in the procedure from the performing physician. 

## 2022-10-16 ENCOUNTER — Telehealth: Payer: Self-pay

## 2022-10-16 NOTE — Telephone Encounter (Signed)
  Follow up Call-     10/15/2022    7:52 AM  Call back number  Post procedure Call Back phone  # [phone]  Permission to leave phone message Yes    Post op call attempted, no answer, no WM

## 2022-10-21 ENCOUNTER — Telehealth: Payer: Self-pay | Admitting: Gastroenterology

## 2022-10-21 MED ORDER — AMOXICILLIN-POT CLAVULANATE 875-125 MG PO TABS: 1.0000 | ORAL_TABLET | Freq: Two times a day (BID) | ORAL | 0 refills | Status: DC

## 2022-10-21 NOTE — Telephone Encounter (Signed)
Inbound call from patient has had lower abdominal pain ad has also had a temp 2 degrees above normal? Please advise.  Thank you

## 2022-10-21 NOTE — Telephone Encounter (Signed)
Pt calling, states that she started having abd pain again on Monday along with temp of 100. Pt reports that tylenol does bring the temp down. Asked if her stools were normal, pt reports she doesn't really have stools. She is scheduled to see the surgeon on Friday but didn't know if she needed to be placed on an antibiotic. Please advise.

## 2022-10-21 NOTE — Telephone Encounter (Signed)
Spoke with pt and she is aware of Dr. Dayle Points recommendations. Script sent to pharmacy.

## 2022-10-22 NOTE — Progress Notes (Signed)
Lisa Price,  The biopsies of the narrowed area in your sigmoid colon showed chronic inflammatory changes related to your diverticular disease, but no cancerous or precancerous changes.  If you proceed with the sigmoid resection, this should completely resolve this problem.  If you do not have surgery and continue to have symptoms, we can discuss trying medications that may help with this inflammation.  Given your history of complicated diverticulitis, I would recommend you proceed with the surgery.

## 2022-10-26 NOTE — Telephone Encounter (Signed)
Patients spouse called with concerns for his wife states she is still under a lot of pain and is seeking further advise.

## 2022-10-26 NOTE — Telephone Encounter (Signed)
Husband states his wife is still having quite a bit of discomfort and running low grade 99-100 temp every day. She started taking the augmentin but saw Dr. Dema Severin on Thursday and he switched her to cipro and flagyl. She is still in pain. They plan to do surgery but that is about a month out. Discussed with Husband that they should contact [facility] if she has not gotten better with the antibiotics that Dr. Dema Severin placed her on, he verbalized understanding.

## 2022-10-28 ENCOUNTER — Emergency Department (HOSPITAL_COMMUNITY): Payer: Medicare PPO

## 2022-10-28 ENCOUNTER — Encounter (HOSPITAL_COMMUNITY): Payer: Self-pay | Admitting: Radiology

## 2022-10-28 ENCOUNTER — Other Ambulatory Visit: Payer: Self-pay

## 2022-10-28 ENCOUNTER — Inpatient Hospital Stay (HOSPITAL_COMMUNITY)
Admission: EM | Admit: 2022-10-28 | Discharge: 2022-10-31 | DRG: 392 | Disposition: A | Discharge status: Home / Self Care | Payer: Medicare PPO | Attending: General Surgery | Admitting: General Surgery

## 2022-10-28 DIAGNOSIS — F32A Depression, unspecified: Secondary | ICD-10-CM | POA: Diagnosis present

## 2022-10-28 DIAGNOSIS — Z79811 Long term (current) use of aromatase inhibitors: Secondary | ICD-10-CM | POA: Diagnosis not present

## 2022-10-28 DIAGNOSIS — K219 Gastro-esophageal reflux disease without esophagitis: Secondary | ICD-10-CM | POA: Diagnosis present

## 2022-10-28 DIAGNOSIS — Z808 Family history of malignant neoplasm of other organs or systems: Secondary | ICD-10-CM

## 2022-10-28 DIAGNOSIS — Z170 Estrogen receptor positive status [ER+]: Secondary | ICD-10-CM

## 2022-10-28 DIAGNOSIS — K5720 Diverticulitis of large intestine with perforation and abscess without bleeding: Principal | ICD-10-CM | POA: Diagnosis present

## 2022-10-28 DIAGNOSIS — Z803 Family history of malignant neoplasm of breast: Secondary | ICD-10-CM

## 2022-10-28 DIAGNOSIS — K5792 Diverticulitis of intestine, part unspecified, without perforation or abscess without bleeding: Secondary | ICD-10-CM | POA: Diagnosis present

## 2022-10-28 DIAGNOSIS — R112 Nausea with vomiting, unspecified: Secondary | ICD-10-CM

## 2022-10-28 DIAGNOSIS — Z23 Encounter for immunization: Secondary | ICD-10-CM

## 2022-10-28 DIAGNOSIS — Z853 Personal history of malignant neoplasm of breast: Secondary | ICD-10-CM

## 2022-10-28 DIAGNOSIS — Z923 Personal history of irradiation: Secondary | ICD-10-CM

## 2022-10-28 DIAGNOSIS — F419 Anxiety disorder, unspecified: Secondary | ICD-10-CM | POA: Diagnosis present

## 2022-10-28 DIAGNOSIS — Z79899 Other long term (current) drug therapy: Secondary | ICD-10-CM

## 2022-10-28 LAB — COMPREHENSIVE METABOLIC PANEL
ALT: 13 U/L (ref 0–44)
AST: 13 U/L — ABNORMAL LOW (ref 15–41)
Albumin: 4.2 g/dL (ref 3.5–5.0)
Alkaline Phosphatase: 99 U/L (ref 38–126)
Anion gap: 7 (ref 5–15)
BUN: 15 mg/dL (ref 8–23)
CO2: 29 mmol/L (ref 22–32)
Calcium: 9.7 mg/dL (ref 8.9–10.3)
Chloride: 105 mmol/L (ref 98–111)
Creatinine, Ser: 0.76 mg/dL (ref 0.44–1.00)
GFR, Estimated: >60 mL/min (ref >60)
Glucose, Bld: 105 mg/dL — ABNORMAL HIGH (ref 70–99)
Potassium: 4.4 mmol/L (ref 3.5–5.1)
Sodium: 141 mmol/L (ref 135–145)
Total Bilirubin: 0.5 mg/dL (ref 0.3–1.2)
Total Protein: 8.2 g/dL — ABNORMAL HIGH (ref 6.5–8.1)

## 2022-10-28 LAB — CBC WITH DIFFERENTIAL/PLATELET
Abs Immature Granulocytes: 0.04 10*3/uL (ref 0.00–0.07)
Basophils Absolute: 0.1 10*3/uL (ref 0.0–0.1)
Basophils Relative: 0 %
Eosinophils Absolute: 0.2 10*3/uL (ref 0.0–0.5)
Eosinophils Relative: 2 %
HCT: 46.9 % — ABNORMAL HIGH (ref 36.0–46.0)
Hemoglobin: 15.1 g/dL — ABNORMAL HIGH (ref 12.0–15.0)
Immature Granulocytes: 0 %
Lymphocytes Relative: 14 %
Lymphs Abs: 1.6 10*3/uL (ref 0.7–4.0)
MCH: 28.4 pg (ref 26.0–34.0)
MCHC: 32.2 g/dL (ref 30.0–36.0)
MCV: 88.2 fL (ref 80.0–100.0)
Monocytes Absolute: 0.9 10*3/uL (ref 0.1–1.0)
Monocytes Relative: 8 %
Neutro Abs: 8.6 10*3/uL — ABNORMAL HIGH (ref 1.7–7.7)
Neutrophils Relative %: 76 %
Platelets: 401 10*3/uL — ABNORMAL HIGH (ref 150–400)
RBC: 5.32 MIL/uL — ABNORMAL HIGH (ref 3.87–5.11)
RDW: 13 % (ref 11.5–15.5)
WBC: 11.4 10*3/uL — ABNORMAL HIGH (ref 4.0–10.5)
nRBC: 0 % (ref 0.0–0.2)

## 2022-10-28 LAB — URINALYSIS, ROUTINE W REFLEX MICROSCOPIC
Bilirubin Urine: NEGATIVE
Glucose, UA: NEGATIVE mg/dL
Hgb urine dipstick: NEGATIVE
Ketones, ur: NEGATIVE mg/dL
Leukocytes,Ua: NEGATIVE
Nitrite: NEGATIVE
Protein, ur: NEGATIVE mg/dL
Specific Gravity, Urine: <1.005 — ABNORMAL LOW (ref 1.005–1.030)
pH: 8.5 — ABNORMAL HIGH (ref 5.0–8.0)

## 2022-10-28 LAB — HIV ANTIBODY (ROUTINE TESTING W REFLEX): HIV Screen 4th Generation wRfx: NONREACTIVE

## 2022-10-28 MED ORDER — SODIUM CHLORIDE (PF) 0.9 % IJ SOLN
INTRAMUSCULAR | Status: AC
  Filled 2022-10-28: qty 50

## 2022-10-28 MED ORDER — PIPERACILLIN-TAZOBACTAM 3.375 G IVPB 30 MIN
3.3750 g | Freq: Once | INTRAVENOUS | Status: AC
  Administered 2022-10-28: 3.375 g via INTRAVENOUS
  Filled 2022-10-28: qty 50

## 2022-10-28 MED ORDER — TRAZODONE HCL 100 MG PO TABS
300.0000 mg | ORAL_TABLET | Freq: Every day | ORAL | Status: DC
  Administered 2022-10-28 – 2022-10-30 (×3): 300 mg via ORAL
  Filled 2022-10-28 (×3): qty 3

## 2022-10-28 MED ORDER — MORPHINE SULFATE (PF) 2 MG/ML IV SOLN
2.0000 mg | INTRAVENOUS | Status: DC | PRN
  Administered 2022-10-28: 2 mg via INTRAVENOUS
  Filled 2022-10-28 (×2): qty 1

## 2022-10-28 MED ORDER — MORPHINE SULFATE (PF) 2 MG/ML IV SOLN
2.0000 mg | Freq: Once | INTRAVENOUS | Status: AC
  Administered 2022-10-28: 2 mg via INTRAVENOUS
  Filled 2022-10-28: qty 1

## 2022-10-28 MED ORDER — LACTATED RINGERS IV BOLUS
1000.0000 mL | Freq: Once | INTRAVENOUS | Status: AC
  Administered 2022-10-28: 1000 mL via INTRAVENOUS

## 2022-10-28 MED ORDER — PIPERACILLIN-TAZOBACTAM 3.375 G IVPB
3.3750 g | Freq: Three times a day (TID) | INTRAVENOUS | Status: DC
  Administered 2022-10-28 – 2022-10-31 (×8): 3.375 g via INTRAVENOUS
  Filled 2022-10-28 (×8): qty 50

## 2022-10-28 MED ORDER — CLONAZEPAM 1 MG PO TABS
1.0000 mg | ORAL_TABLET | Freq: Once | ORAL | Status: DC
  Filled 2022-10-28: qty 1

## 2022-10-28 MED ORDER — ACETAMINOPHEN 500 MG PO TABS
1000.0000 mg | ORAL_TABLET | ORAL | Status: AC
  Administered 2022-10-28: 1000 mg via ORAL
  Filled 2022-10-28: qty 2

## 2022-10-28 MED ORDER — IOHEXOL 300 MG/ML  SOLN
100.0000 mL | Freq: Once | INTRAMUSCULAR | Status: AC | PRN
  Administered 2022-10-28: 100 mL via INTRAVENOUS

## 2022-10-28 MED ORDER — INFLUENZA VAC A&B SA ADJ QUAD 0.5 ML IM PRSY
0.5000 mL | PREFILLED_SYRINGE | INTRAMUSCULAR | Status: AC
  Administered 2022-10-31: 0.5 mL via INTRAMUSCULAR
  Filled 2022-10-28: qty 0.5

## 2022-10-28 MED ORDER — LACTATED RINGERS IV SOLN: INTRAVENOUS | Status: DC

## 2022-10-28 MED ORDER — ONDANSETRON HCL 4 MG/2ML IJ SOLN
4.0000 mg | Freq: Once | INTRAMUSCULAR | Status: AC
  Administered 2022-10-28: 4 mg via INTRAVENOUS
  Filled 2022-10-28: qty 2

## 2022-10-28 MED ORDER — PROCHLORPERAZINE EDISYLATE 10 MG/2ML IJ SOLN
10.0000 mg | Freq: Four times a day (QID) | INTRAMUSCULAR | Status: DC | PRN
  Administered 2022-10-28 – 2022-10-30 (×3): 10 mg via INTRAVENOUS
  Filled 2022-10-28 (×3): qty 2

## 2022-10-28 NOTE — ED Provider Notes (Signed)
[facility] DEPT Provider Note   CSN: [id number] Arrival date & time: 10/28/22  2202     History {Add pertinent medical, surgical, social history, OB history to HPI:1} Chief Complaint  Patient presents with   Abdominal Pain    Lisa Price is a 66 y.o. female.  66 year old female with a history of diverticulitis who presents to the emergency department with lower abdominal pain and nausea and vomiting.  Was diagnosed with diverticulitis on***.  Since has had 2 CT scans which did not show drainable collection but did have a colonoscopy on 10/15/22 that showed severe diverticulitis with luminal narrowing.  Only abdominal surgery is a hysterectomy.  Does have planned surgery for her diverticulitis in January.  Was initially treated with Augmentin but was changed to Cipro/Flagyl over the weekend and is reported persistent pain in her bilateral lower quadrants as well as nausea and vomiting.  Has had low-grade fevers.  As well as several episodes of nonbloody nonbilious emesis.  Says that she has a pressure-like sensation that feels like a UTI but off to the side of her abdomen.       Home Medications Prior to Admission medications   Medication Sig Start Date End Date Taking? Authorizing Provider  amoxicillin-clavulanate (AUGMENTIN) 875-125 MG tablet Take 1 tablet by mouth 2 (two) times daily. 10/21/22   Daryel November, MD  clonazePAM (KLONOPIN) 1 MG tablet Take 1 mg by mouth See admin instructions. Take 1 mg by mouth two to three times a days as needed for anxiety 07/16/18   [provider]  desvenlafaxine (PRISTIQ) 100 MG 24 hr tablet Take 100 mg by mouth in the morning.    [provider]  dicyclomine (BENTYL) 20 MG tablet Take 1 tablet (20 mg total) by mouth every 6 (six) hours as needed for spasms. 09/23/22   Daryel November, MD  ondansetron (ZOFRAN) 4 MG tablet Take 1 tablet (4 mg total) by mouth every 6 (six) hours as needed for  nausea or vomiting. 09/23/22   Daryel November, MD  traZODone (DESYREL) 100 MG tablet Take 300 mg by mouth at bedtime. 11/08/20   [provider]  VYVANSE 70 MG capsule Take 70 mg by mouth in the morning.    [provider]      Allergies    Patient has no known allergies.    Review of Systems   Review of Systems  Physical Exam Updated Vital Signs BP 116/76   Pulse 78   Temp 98.2 F (36.8 C) (Oral)   Resp 18   SpO2 95%  Physical Exam Vitals and nursing note reviewed.  Constitutional:      General: She is not in acute distress.    Appearance: She is well-developed.  HENT:     Head: Normocephalic and atraumatic.     Right Ear: External ear normal.     Left Ear: External ear normal.     Nose: Nose normal.  Eyes:     Extraocular Movements: Extraocular movements intact.     Conjunctiva/sclera: Conjunctivae normal.     Pupils: Pupils are equal, round, and reactive to light.  Cardiovascular:     Rate and Rhythm: Normal rate and regular rhythm.     Heart sounds: No murmur heard. Pulmonary:     Effort: Pulmonary effort is normal. No respiratory distress.     Breath sounds: Normal breath sounds.  Abdominal:     General: Abdomen is flat. There is no distension.  Palpations: Abdomen is soft. There is no mass.     Tenderness: There is abdominal tenderness (Bilateral lower quadrants). There is no guarding.  Musculoskeletal:        General: No swelling.     Cervical back: Normal range of motion and neck supple.     Right lower leg: No edema.     Left lower leg: No edema.  Skin:    General: Skin is warm and dry.     Capillary Refill: Capillary refill takes less than 2 seconds.  Neurological:     Mental Status: She is alert and oriented to person, place, and time. Mental status is at baseline.  Psychiatric:        Mood and Affect: Mood normal.     ED Results / Procedures / Treatments   Labs (all labs ordered are listed, but only abnormal results are  displayed) Labs Reviewed  CBC WITH DIFFERENTIAL/PLATELET - Abnormal; Notable for the following components:      Result Value   WBC 11.4 (*)    RBC 5.32 (*)    Hemoglobin 15.1 (*)    HCT 46.9 (*)    Platelets 401 (*)    Neutro Abs 8.6 (*)    All other components within normal limits  COMPREHENSIVE METABOLIC PANEL - Abnormal; Notable for the following components:   Glucose, Bld 105 (*)    Total Protein 8.2 (*)    AST 13 (*)    All other components within normal limits  URINALYSIS, ROUTINE W REFLEX MICROSCOPIC    EKG None  Radiology No results found.  Procedures Procedures   Medications Ordered in ED Medications  sodium chloride (PF) 0.9 % injection (has no administration in time range)    ED Course/ Medical Decision Making/ A&P                           Medical Decision Making Amount and/or Complexity of Data Reviewed Labs: ordered.  Risk OTC drugs. Prescription drug management.   ***  {Document critical care time when appropriate:1} {Document review of labs and clinical decision tools ie heart score, Chads2Vasc2 etc:1}  {Document your independent review of radiology images, and any outside records:1} {Document your discussion with family members, caretakers, and with consultants:1} {Document social determinants of health affecting pt's care:1} {Document your decision making why or why not admission, treatments were needed:1} Final Clinical Impression(s) / ED Diagnoses Final diagnoses:  None    Rx / DC Orders ED Discharge Orders     None

## 2022-10-28 NOTE — Progress Notes (Signed)
Request received from [facility] to review intraabdominal fluid collections for possible drain placement. Dr. Anselm Pancoast reviewed imaging and determined that the fluid collections are not drainable.  [facility] made aware of findings.     Narda Rutherford, AGNP-BC 10/28/2022, 4:08 PM

## 2022-10-28 NOTE — Consult Note (Addendum)
Lisa Price 05-12-1956  [id number].    Requesting MD: Dr. Margaretmary Eddy Chief Complaint/Reason for Consult: Diverticulitis  HPI: Lisa Price is a 66 y.o. female known to our service and followed by Dr. Dema Severin for diverticulitis who presented with abdominal pain.  She was admitted to our emergency general surgery service in August with diverticulitis, treated with antibiotics with improvement and was d/c'd home. She tells me that her abdominal discomfort has significant improved sine then but she has never gone back to feeling "normal". She underwent a colonoscopy by Dr. Candis Schatz on 10/15/2022 with findings of severe diverticulosis in the sigmoid and descending colon with narrowing in the sigmoid colon;  biopsy results were benign. She also tells me she had a lot of "trapped air" after her colonoscopy requiring her to return to the endoscopy center for endoscopic decompression.  She was given a course of Augmentin by GI last week for suspected recurrent bout of diverticulitis.  Then saw Dr. Dema Severin in the office on 11/17 and antibiotics were changed to Cipro/Flagyl. They also discussed surgery and it appears she has a planned robotic LAR on 12/17/2022 with Dr. Dema Severin.  Patient presented to the ED today as her pain in her lower abdomen has continued despite changes in her antibiotic.  She notes associated nausea and one episode of vomiting this morning. Also reports low grade fevers at home (~100) treated with tylenol.   Denies constipation, diarrhea, pneumaturia or stool in her urine.   She has a history of left breast cancer status post lumpectomy, radiation, and is supposed to be taking anastrozole, sees Dr. Lindi Adie.  Other history includes obstructive sleep apnea, depression and anxiety.  She reports she had a hysterectomy but no other abdominal surgery. Not on any blood thinners.   ROS: ROS As above, see hpi  Family History  Problem Relation Age of Onset   Melanoma Mother    Thyroid cancer  Father        unknown age diagnosed   Thyroid cancer Maternal Grandmother        dx after age 43   Breast cancer Paternal Grandmother        dx before age 44   Colon cancer Neg Hx    Esophageal cancer Neg Hx    Rectal cancer Neg Hx    Stomach cancer Neg Hx     Past Medical History:  Diagnosis Date   Acid reflux    Anxiety    Cancer ([place])    left breast CA   Depression    Family history of breast cancer 12/25/2020   Family history of thyroid cancer 12/25/2020   GERD (gastroesophageal reflux disease)    Hiatal hernia    History of radiation therapy 02/24/21-03/25/21   Left breast- Dr. Gery Pray   Personal history of radiation therapy    Sleep apnea     Past Surgical History:  Procedure Laterality Date   ABDOMINAL HYSTERECTOMY     BREAST BIOPSY Left 12/19/2020   BREAST LUMPECTOMY Left 01/2021   BREAST LUMPECTOMY WITH RADIOACTIVE SEED AND SENTINEL LYMPH NODE BIOPSY Left 01/10/2021   Procedure: LEFT BREAST LUMPECTOMY WITH RADIOACTIVE SEED AND SENTINEL LYMPH NODE BIOPSY;  Surgeon: Coralie Keens, MD;  Location: [facility];  Service: General;  Laterality: Left;   COLONOSCOPY      Social History:  reports that she has never smoked. She has never used smokeless tobacco. She reports that she does not drink alcohol and does not  use drugs.  Allergies: No Known Allergies  (Not in a hospital admission)    Physical Exam: Blood pressure 116/76, pulse 78, temperature 98.2 F (36.8 C), temperature source Oral, resp. rate 18, SpO2 95 %. General: pleasant, WD/WN female who is laying in bed in NAD HEENT: head is normocephalic, atraumatic.  Sclera are noninjected.  PERRL.  Ears and nose without any masses or lesions.  Mouth is pink and moist. Dentition fair Heart: regular, rate, and rhythm.  Palpable pedal pulses bilaterally  Lungs: CTAB, no wheezes, rhonchi, or rales noted.  Respiratory effort nonlabored Abd:  Soft, mild lower abdominal tenderness in the SP  region and LLQ withou rebound tenderness, ND,. No masses, hernias, or organomegaly MS: no BUE or BLE edema, calves soft and nontender Skin: warm and dry  Psych: A&Ox4 with appropriate speech Neuro: cranial nerves grossly intact, normal speech, thought process intact, moves all extremities, gait not assessed   Results for orders placed or performed during the hospital encounter of 10/28/22 (from the past 48 hour(s))  CBC with Differential     Status: Abnormal   Collection Time: 10/28/22 10:51 AM  Result Value Ref Range   WBC 11.4 (H) 4.0 - 10.5 K/uL   RBC 5.32 (H) 3.87 - 5.11 MIL/uL   Hemoglobin 15.1 (H) 12.0 - 15.0 g/dL   HCT 46.9 (H) 36.0 - 46.0 %   MCV 88.2 80.0 - 100.0 fL   MCH 28.4 26.0 - 34.0 pg   MCHC 32.2 30.0 - 36.0 g/dL   RDW 13.0 11.5 - 15.5 %   Platelets 401 (H) 150 - 400 K/uL   nRBC 0.0 0.0 - 0.2 %   Neutrophils Relative % 76 %   Neutro Abs 8.6 (H) 1.7 - 7.7 K/uL   Lymphocytes Relative 14 %   Lymphs Abs 1.6 0.7 - 4.0 K/uL   Monocytes Relative 8 %   Monocytes Absolute 0.9 0.1 - 1.0 K/uL   Eosinophils Relative 2 %   Eosinophils Absolute 0.2 0.0 - 0.5 K/uL   Basophils Relative 0 %   Basophils Absolute 0.1 0.0 - 0.1 K/uL   Immature Granulocytes 0 %   Abs Immature Granulocytes 0.04 0.00 - 0.07 K/uL    Comment: Performed at [facility], [place] [address], [place], [place] [address]  Comprehensive metabolic panel     Status: Abnormal   Collection Time: 10/28/22 10:51 AM  Result Value Ref Range   Sodium 141 135 - 145 mmol/L   Potassium 4.4 3.5 - 5.1 mmol/L   Chloride 105 98 - 111 mmol/L   CO2 29 22 - 32 mmol/L   Glucose, Bld 105 (H) 70 - 99 mg/dL    Comment: Glucose reference range applies only to samples taken after fasting for at least 8 hours.   BUN 15 8 - 23 mg/dL   Creatinine, Ser 0.76 0.44 - 1.00 mg/dL   Calcium 9.7 8.9 - 10.3 mg/dL   Total Protein 8.2 (H) 6.5 - 8.1 g/dL   Albumin 4.2 3.5 - 5.0 g/dL   AST 13 (L) 15 - 41 U/L   ALT 13 0 - 44  U/L   Alkaline Phosphatase 99 38 - 126 U/L   Total Bilirubin 0.5 0.3 - 1.2 mg/dL   GFR, Estimated >60 >60 mL/min    Comment: (NOTE) Calculated using the CKD-EPI Creatinine Equation (2021)    Anion gap 7 5 - 15    Comment: Performed at [facility], [address] [place], [place]  [address]   CT ABDOMEN PELVIS W CONTRAST  Result Date: 10/28/2022 CLINICAL DATA:  Lower abdominal pain, nausea and vomiting, history of diverticulitis EXAM: CT ABDOMEN AND PELVIS WITH CONTRAST TECHNIQUE: Multidetector CT imaging of the abdomen and pelvis was performed using the standard protocol following bolus administration of intravenous contrast. RADIATION DOSE REDUCTION: This exam was performed according to the departmental dose-optimization program which includes automated exposure control, adjustment of the mA and/or kV according to patient size and/or use of iterative reconstruction technique. CONTRAST:  133m OMNIPAQUE IOHEXOL 300 MG/ML  SOLN COMPARISON:  07/21/2022, 06/24/2022 FINDINGS: Lower chest: No acute pleural or parenchymal lung disease. Hepatobiliary: No focal liver abnormality is seen. No gallstones, gallbladder wall thickening, or biliary dilatation. Pancreas: Unremarkable. No pancreatic ductal dilatation or surrounding inflammatory changes. Spleen: Normal in size without focal abnormality. Adrenals/Urinary Tract: Adrenal glands are unremarkable. Kidneys are normal, without renal calculi, focal lesion, or hydronephrosis. Bladder is unremarkable. Stomach/Bowel: There is extensive mural thickening of the distal rectosigmoid colon, with pronounced pericolonic fat stranding consistent with acute diverticulitis. There are 2 small diverticular abscesses identified, the largest measuring 3.1 x 1.9 by 3.3 cm reference image 72/2, and the second more inferiorly located in a perirectal location measuring 1.9 x 1.4 by 2.1 cm reference image 81/2. This chronic inflamed section of rectosigmoid colon  results in effacement of fat plane between the sigmoid colon and dome of the bladder on the left, though the bladder wall thickening seen previously has resolved in the interim. No definite colovesical fistula. No bowel obstruction.  Normal appendix.  Small hiatal hernia. Vascular/Lymphatic: No significant vascular findings. Subcentimeter lymph nodes are seen within the pericolonic mesentery lower pelvis compatible with reactive changes. No pathologic adenopathy. Reproductive: Status post hysterectomy. No adnexal masses. Other: No free fluid or free intraperitoneal gas. No abdominal wall hernia. Musculoskeletal: No acute or destructive bony lesions. Reconstructed images demonstrate no additional findings. IMPRESSION: 1. Complicated acute diverticulitis of the rectosigmoid colon, with 2 new small diverticular abscesses as above. 2. Effacement of the fat plane between the chronically inflamed sigmoid colon and dome of the bladder on the left. Resolution of the bladder wall thickening seen previously. No definitive colovesical fistula seen by imaging. 3. Small hiatal hernia. Electronically Signed   By: MRanda NgoM.D.   On: 10/28/2022 12:51    Anti-infectives (From admission, onward)    Start     Dose/Rate Route Frequency Ordered Stop   10/28/22 1330  piperacillin-tazobactam (ZOSYN) IVPB 3.375 g        3.375 g 100 mL/hr over 30 Minutes Intravenous  Once 10/28/22 1329         Assessment/Plan Rectosigmoid diverticulitis with abscess - CT w/ acute diverticulitis of the rectosigmoid colon, with 2 new small diverticular abscesses measuring 3.1 x 1.9 by 3.3 cm and 1.9 x 1.4 by 2.1 cm .  - Patient is afebrile without tachycardia, or hypotension. WBC 11.4. No lactic checked. No peritonitis on exam, No current indication for emergency surgery. Suspect intra-abdominal fluid collections are to small to drain but will check with IR.  - Agree with IV antibiotics - Keep NPO - Hopefully patient will improve with  non-operative treatment and we can get her to her surgery with Dr. WDema Severinplanned for January. If patient fails to improve she may require repeating imaging, drain placement, or surgical intervention resulting in a colectomy/colostomy.  This was discussed with the patient. - We will follow with you  FEN - NPO, IVF per [facility] VTE - SCDs,  okay for chem ppx from a general surgery standpoint ID - Zosyn Foley - None Dispo - Admit to [facility].   I reviewed nursing notes, ED provider notes, last 24 h vitals and pain scores, last 48 h intake and output, last 24 h labs and trends, and last 24 h imaging results  Obie Dredge, [facility] Surgery 10/28/2022, 1:51 PM Please see Amion for pager number during day hours 7:00am-4:30pm

## 2022-10-28 NOTE — ED Provider Triage Note (Signed)
Emergency Medicine Provider Triage Evaluation Note  Lisa Price , a 66 y.o. female  was evaluated in triage.  Pt complains of abdominal pain.  Pt feels like she did when she had diverticultis with a n abscess.  Pt's surgeon advised her to come in for evalatuion   Review of Systems  Positive: pain Negative: fever Physical Exam  BP (!) 114/101 (BP Location: Right Arm)   Pulse 86   Temp 98.2 F (36.8 C) (Oral)   Resp 16   SpO2 96%  Gen:   Awake, no distress   Resp:  Normal effort  MSK:   Moves extremities without difficulty  Other:    Medical Decision Making  Medically screening exam initiated at 10:42 AM.  Appropriate orders placed.  Octavio Graves was informed that the remainder of the evaluation will be completed by another provider, this initial triage assessment does not replace that evaluation, and the importance of remaining in the ED until their evaluation is complete.     Fransico Meadow, [place] 10/28/22 1044

## 2022-10-28 NOTE — ED Triage Notes (Signed)
C/o lower abd pain with n/v.  Was seen 2 weeks ago for colonoscopy and diagnosed with diverticulitis. Prescribed Augmentin w/o relief, surgeon d/c Augmentin and started flagyl and Cipro. Pt states pain no better and worse with movement.

## 2022-10-28 NOTE — Progress Notes (Signed)
Pharmacy Antibiotic Note  Lisa Price is a 66 y.o. female admitted on 10/28/2022 with abdominal pain. Patient recently diagnosed with diverticulitis and prescribed Augmentin which was later changed to ciprofloxacin/metronidazole for persistent abdominal pain. CT abd/pelvis with new diverticular abscess, not amenable to drainage. Pharmacy has been consulted for Zosyn dosing.  Plan: Zosyn 3.375 g IV q8h extended infusion     Temp (24hrs), Avg:98.1 F (36.7 C), Min:97.9 F (36.6 C), Max:98.2 F (36.8 C)  Recent Labs  Lab 10/28/22 1051  WBC 11.4*  CREATININE 0.76    Estimated Creatinine Clearance: 72 mL/min (by C-G formula based on SCr of 0.76 mg/dL).    No Known Allergies  Antimicrobials this admission: Zosyn 11/22 >>  Dose adjustments this admission: NA  Microbiology results: NA  Thank you for allowing pharmacy to be a part of this patient's care.  Tawnya Crook, PharmD, BCPS Clinical Pharmacist 10/28/2022 5:40 PM

## 2022-10-28 NOTE — H&P (Signed)
History and Physical    Patient: Lisa Price [id number] DOB: November 23, 1956 DOA: 10/28/2022 DOS: the patient was seen and examined on 10/28/2022 PCP: Charlane Ferretti, MD  Patient coming from: Home  Chief Complaint:  Chief Complaint  Patient presents with   Abdominal Pain   HPI: Lisa Price is a 66 y.o. female with medical history significant of IVS, OSA, breast CA. Presenting with abdominal pain. She was admitted for the same a few months ago. She had a follow up c-scope w/ [facility] on 10/15/22. At the time, she was diagnosed w/ diverticulitis. She was started on augmentin. However, her abdominal pain did not improve. She followed up with general surgery 5 days ago and her abx was switched to cipro/flagyl. She reports compliance with her regimen. Her ab pain remains persistent in the RLQ/LLQ. It's sharp. She has had temps up to 100. She has had N/V. When her symptoms did not improve this morning, she decided to come to the ED for evaluation. She denies any other aggravating or alleviating factors.   Review of Systems: As mentioned in the history of present illness. All other systems reviewed and are negative. Past Medical History:  Diagnosis Date   Acid reflux    Anxiety    Cancer ([place])    left breast CA   Depression    Family history of breast cancer 12/25/2020   Family history of thyroid cancer 12/25/2020   GERD (gastroesophageal reflux disease)    Hiatal hernia    History of radiation therapy 02/24/21-03/25/21   Left breast- Dr. Gery Pray   Personal history of radiation therapy    Sleep apnea    Past Surgical History:  Procedure Laterality Date   ABDOMINAL HYSTERECTOMY     BREAST BIOPSY Left 12/19/2020   BREAST LUMPECTOMY Left 01/2021   BREAST LUMPECTOMY WITH RADIOACTIVE SEED AND SENTINEL LYMPH NODE BIOPSY Left 01/10/2021   Procedure: LEFT BREAST LUMPECTOMY WITH RADIOACTIVE SEED AND SENTINEL LYMPH NODE BIOPSY;  Surgeon: Coralie Keens, MD;  Location: [place] [facility];  Service: General;  Laterality: Left;   COLONOSCOPY     Social History:  reports that she has never smoked. She has never used smokeless tobacco. She reports that she does not drink alcohol and does not use drugs.  No Known Allergies  Family History  Problem Relation Age of Onset   Melanoma Mother    Thyroid cancer Father        unknown age diagnosed   Thyroid cancer Maternal Grandmother        dx after age 75   Breast cancer Paternal Grandmother        dx before age 60   Colon cancer Neg Hx    Esophageal cancer Neg Hx    Rectal cancer Neg Hx    Stomach cancer Neg Hx     Prior to Admission medications   Medication Sig Start Date End Date Taking? Authorizing Provider  ciprofloxacin (CIPRO) 500 MG tablet Take 500 mg by mouth 2 (two) times daily. 10/23/22 11/06/22 Yes [provider]  clonazePAM (KLONOPIN) 1 MG tablet Take 1-2 mg by mouth 3 (three) times daily as needed for anxiety. 07/16/18  Yes [provider]  desvenlafaxine (PRISTIQ) 100 MG 24 hr tablet Take 100 mg by mouth in the morning.   Yes [provider]  dicyclomine (BENTYL) 20 MG tablet Take 1 tablet (20 mg total) by mouth every 6 (six) hours as needed for spasms. 09/23/22  Yes Dustin Flock  E, MD  metroNIDAZOLE (FLAGYL) 500 MG tablet Take 500 mg by mouth 2 (two) times daily. 10/23/22 11/06/22 Yes [provider]  ondansetron (ZOFRAN) 4 MG tablet Take 1 tablet (4 mg total) by mouth every 6 (six) hours as needed for nausea or vomiting. 09/23/22  Yes Daryel November, MD  traZODone (DESYREL) 100 MG tablet Take 400 mg by mouth at bedtime. 11/08/20  Yes [provider]  VYVANSE 70 MG capsule Take 70 mg by mouth in the morning.   Yes [provider]  amoxicillin-clavulanate (AUGMENTIN) 875-125 MG tablet Take 1 tablet by mouth 2 (two) times daily. Patient not taking: Reported on 10/28/2022 10/21/22   Daryel November, MD    Physical Exam: Vitals:   10/28/22  0951 10/28/22 1154 10/28/22 1400 10/28/22 1524  BP: (!) 114/101 116/76 106/69 123/77  Pulse: 86 78 71 72  Resp: '16 18  16  '$ Temp: 98.2 F (36.8 C)   97.9 F (36.6 C)  TempSrc: Oral   Oral  SpO2: 96% 95% 96% 98%   General: 66 y.o. female resting in bed in NAD Eyes: PERRL, normal sclera ENMT: Nares patent w/o discharge, orophaynx clear, dentition normal, ears w/o discharge/lesions/ulcers Neck: Supple, trachea midline Cardiovascular: RRR, +S1, S2, no m/g/r, equal pulses throughout Respiratory: CTABL, no w/r/r, normal WOB GI: BS+, ND, LLQ TTP, no masses noted, no organomegaly noted MSK: No e/c/c Neuro: A&O x 3, no focal deficits Psyc: Appropriate interaction and affect, calm/cooperative  Data Reviewed:  Results for orders placed or performed during the hospital encounter of 10/28/22 (from the past 24 hour(s))  CBC with Differential     Status: Abnormal   Collection Time: 10/28/22 10:51 AM  Result Value Ref Range   WBC 11.4 (H) 4.0 - 10.5 K/uL   RBC 5.32 (H) 3.87 - 5.11 MIL/uL   Hemoglobin 15.1 (H) 12.0 - 15.0 g/dL   HCT 46.9 (H) 36.0 - 46.0 %   MCV 88.2 80.0 - 100.0 fL   MCH 28.4 26.0 - 34.0 pg   MCHC 32.2 30.0 - 36.0 g/dL   RDW 13.0 11.5 - 15.5 %   Platelets 401 (H) 150 - 400 K/uL   nRBC 0.0 0.0 - 0.2 %   Neutrophils Relative % 76 %   Neutro Abs 8.6 (H) 1.7 - 7.7 K/uL   Lymphocytes Relative 14 %   Lymphs Abs 1.6 0.7 - 4.0 K/uL   Monocytes Relative 8 %   Monocytes Absolute 0.9 0.1 - 1.0 K/uL   Eosinophils Relative 2 %   Eosinophils Absolute 0.2 0.0 - 0.5 K/uL   Basophils Relative 0 %   Basophils Absolute 0.1 0.0 - 0.1 K/uL   Immature Granulocytes 0 %   Abs Immature Granulocytes 0.04 0.00 - 0.07 K/uL  Comprehensive metabolic panel     Status: Abnormal   Collection Time: 10/28/22 10:51 AM  Result Value Ref Range   Sodium 141 135 - 145 mmol/L   Potassium 4.4 3.5 - 5.1 mmol/L   Chloride 105 98 - 111 mmol/L   CO2 29 22 - 32 mmol/L   Glucose, Bld 105 (H) 70 - 99 mg/dL    BUN 15 8 - 23 mg/dL   Creatinine, Ser 0.76 0.44 - 1.00 mg/dL   Calcium 9.7 8.9 - 10.3 mg/dL   Total Protein 8.2 (H) 6.5 - 8.1 g/dL   Albumin 4.2 3.5 - 5.0 g/dL   AST 13 (L) 15 - 41 U/L   ALT 13 0 - 44 U/L  Alkaline Phosphatase 99 38 - 126 U/L   Total Bilirubin 0.5 0.3 - 1.2 mg/dL   GFR, Estimated >60 >60 mL/min   Anion gap 7 5 - 15   CT ab/pelvis 1. Complicated acute diverticulitis of the rectosigmoid colon, with 2 new small diverticular abscesses as above. 2. Effacement of the fat plane between the chronically inflamed sigmoid colon and dome of the bladder on the left. Resolution of the bladder wall thickening seen previously. No definitive colovesical fistula seen by imaging. 3. Small hiatal hernia.  Assessment and Plan: Diverticulitis w/ abscess     - admit to inpt, med-surg     - continue zosyn, fluids, anti-emetics, pain control     - NPO for now  Anxiety     - continue home regimen when off NPO status  Hx of breast CA     - continue outpt follow up  Advance Care Planning:   Code Status: FULL  Consults: General Surgery  Family Communication: w/ husband at bedside  Severity of Illness: The appropriate patient status for this patient is INPATIENT. Inpatient status is judged to be reasonable and necessary in order to provide the required intensity of service to ensure the patient's safety. The patient's presenting symptoms, physical exam findings, and initial radiographic and laboratory data in the context of their chronic comorbidities is felt to place them at high risk for further clinical deterioration. Furthermore, it is not anticipated that the patient will be medically stable for discharge from the hospital within 2 midnights of admission.   * I certify that at the point of admission it is my clinical judgment that the patient will require inpatient hospital care spanning beyond 2 midnights from the point of admission due to high intensity of service, high risk for  further deterioration and high frequency of surveillance required.*  Author: Jonnie Finner, DO 10/28/2022 3:27 PM  For on call review [email or web address].

## 2022-10-29 ENCOUNTER — Encounter (HOSPITAL_COMMUNITY): Payer: Self-pay | Admitting: Internal Medicine

## 2022-10-29 DIAGNOSIS — K5720 Diverticulitis of large intestine with perforation and abscess without bleeding: Secondary | ICD-10-CM | POA: Diagnosis not present

## 2022-10-29 DIAGNOSIS — R112 Nausea with vomiting, unspecified: Secondary | ICD-10-CM

## 2022-10-29 DIAGNOSIS — K5792 Diverticulitis of intestine, part unspecified, without perforation or abscess without bleeding: Secondary | ICD-10-CM | POA: Diagnosis not present

## 2022-10-29 LAB — CBC
HCT: 39.1 % (ref 36.0–46.0)
Hemoglobin: 12.3 g/dL (ref 12.0–15.0)
MCH: 28.1 pg (ref 26.0–34.0)
MCHC: 31.5 g/dL (ref 30.0–36.0)
MCV: 89.5 fL (ref 80.0–100.0)
Platelets: 334 10*3/uL (ref 150–400)
RBC: 4.37 MIL/uL (ref 3.87–5.11)
RDW: 13.1 % (ref 11.5–15.5)
WBC: 8.1 10*3/uL (ref 4.0–10.5)
nRBC: 0 % (ref 0.0–0.2)

## 2022-10-29 LAB — COMPREHENSIVE METABOLIC PANEL
ALT: 10 U/L (ref 0–44)
AST: 12 U/L — ABNORMAL LOW (ref 15–41)
Albumin: 3.2 g/dL — ABNORMAL LOW (ref 3.5–5.0)
Alkaline Phosphatase: 68 U/L (ref 38–126)
Anion gap: 7 (ref 5–15)
BUN: 13 mg/dL (ref 8–23)
CO2: 26 mmol/L (ref 22–32)
Calcium: 8.8 mg/dL — ABNORMAL LOW (ref 8.9–10.3)
Chloride: 105 mmol/L (ref 98–111)
Creatinine, Ser: 0.83 mg/dL (ref 0.44–1.00)
GFR, Estimated: >60 mL/min (ref >60)
Glucose, Bld: 97 mg/dL (ref 70–99)
Potassium: 3.8 mmol/L (ref 3.5–5.1)
Sodium: 138 mmol/L (ref 135–145)
Total Bilirubin: 0.6 mg/dL (ref 0.3–1.2)
Total Protein: 6 g/dL — ABNORMAL LOW (ref 6.5–8.1)

## 2022-10-29 MED ORDER — ACETAMINOPHEN 325 MG PO TABS
650.0000 mg | ORAL_TABLET | Freq: Four times a day (QID) | ORAL | Status: DC | PRN
  Administered 2022-10-29: 650 mg via ORAL
  Filled 2022-10-29: qty 2

## 2022-10-29 MED ORDER — TRAMADOL HCL 50 MG PO TABS: 50.0000 mg | ORAL_TABLET | Freq: Four times a day (QID) | ORAL | Status: DC | PRN

## 2022-10-29 MED ORDER — ENSURE ENLIVE PO LIQD
237.0000 mL | Freq: Two times a day (BID) | ORAL | Status: DC
  Administered 2022-10-29 – 2022-10-31 (×2): 237 mL via ORAL

## 2022-10-29 MED ORDER — DESVENLAFAXINE SUCCINATE ER 50 MG PO TB24
100.0000 mg | ORAL_TABLET | Freq: Every morning | ORAL | Status: DC
  Administered 2022-10-29 – 2022-10-31 (×3): 100 mg via ORAL
  Filled 2022-10-29 (×3): qty 2

## 2022-10-29 MED ORDER — TRAZODONE HCL 100 MG PO TABS: 300.0000 mg | ORAL_TABLET | Freq: Every day | ORAL | Status: DC

## 2022-10-29 MED ORDER — LISDEXAMFETAMINE DIMESYLATE 50 MG PO CAPS
70.0000 mg | ORAL_CAPSULE | Freq: Every day | ORAL | Status: DC
  Administered 2022-10-31: 70 mg via ORAL
  Filled 2022-10-29 (×2): qty 1

## 2022-10-29 MED ORDER — LISDEXAMFETAMINE DIMESYLATE 70 MG PO CAPS: 70.0000 mg | ORAL_CAPSULE | Freq: Every morning | ORAL | Status: DC

## 2022-10-29 MED ORDER — CLONAZEPAM 1 MG PO TABS: 1.0000 mg | ORAL_TABLET | Freq: Three times a day (TID) | ORAL | Status: DC | PRN

## 2022-10-29 NOTE — Progress Notes (Signed)
Subjective/Chief Complaint: Wants to be on home meds, thinks she had flatus, no n/v, ab about same   Objective: Vital signs in last 24 hours: Temp:  [97.9 F (36.6 C)-98.6 F (37 C)] 98.6 F (37 C) (11/23 0612) Pulse Rate:  [65-86] 75 (11/23 0612) Resp:  [16-20] 16 (11/23 0612) BP: (100-123)/(69-101) 115/69 (11/23 0612) SpO2:  [95 %-100 %] 100 % (11/23 0612)    Intake/Output from previous day: 11/22 0701 - 11/23 0700 In: 423.2 [I.V.:423.2] Out: 600 [Urine:600] Intake/Output this shift: No intake/output data recorded.  Ab soft mild tender in sp and llq, nondistended  Lab Results:  Recent Labs    10/28/22 1051 10/29/22 0437  WBC 11.4* 8.1  HGB 15.1* 12.3  HCT 46.9* 39.1  PLT 401* 334   BMET Recent Labs    10/28/22 1051 10/29/22 0437  NA 141 138  K 4.4 3.8  CL 105 105  CO2 29 26  GLUCOSE 105* 97  BUN 15 13  CREATININE 0.76 0.83  CALCIUM 9.7 8.8*   PT/INR No results for input(s): "LABPROT", "INR" in the last 72 hours. ABG No results for input(s): "PHART", "HCO3" in the last 72 hours.  Invalid input(s): "PCO2", "PO2"  Studies/Results: CT ABDOMEN PELVIS W CONTRAST  Result Date: 10/28/2022 CLINICAL DATA:  Lower abdominal pain, nausea and vomiting, history of diverticulitis EXAM: CT ABDOMEN AND PELVIS WITH CONTRAST TECHNIQUE: Multidetector CT imaging of the abdomen and pelvis was performed using the standard protocol following bolus administration of intravenous contrast. RADIATION DOSE REDUCTION: This exam was performed according to the departmental dose-optimization program which includes automated exposure control, adjustment of the mA and/or kV according to patient size and/or use of iterative reconstruction technique. CONTRAST:  166m OMNIPAQUE IOHEXOL 300 MG/ML  SOLN COMPARISON:  07/21/2022, 06/24/2022 FINDINGS: Lower chest: No acute pleural or parenchymal lung disease. Hepatobiliary: No focal liver abnormality is seen. No gallstones, gallbladder wall  thickening, or biliary dilatation. Pancreas: Unremarkable. No pancreatic ductal dilatation or surrounding inflammatory changes. Spleen: Normal in size without focal abnormality. Adrenals/Urinary Tract: Adrenal glands are unremarkable. Kidneys are normal, without renal calculi, focal lesion, or hydronephrosis. Bladder is unremarkable. Stomach/Bowel: There is extensive mural thickening of the distal rectosigmoid colon, with pronounced pericolonic fat stranding consistent with acute diverticulitis. There are 2 small diverticular abscesses identified, the largest measuring 3.1 x 1.9 by 3.3 cm reference image 72/2, and the second more inferiorly located in a perirectal location measuring 1.9 x 1.4 by 2.1 cm reference image 81/2. This chronic inflamed section of rectosigmoid colon results in effacement of fat plane between the sigmoid colon and dome of the bladder on the left, though the bladder wall thickening seen previously has resolved in the interim. No definite colovesical fistula. No bowel obstruction.  Normal appendix.  Small hiatal hernia. Vascular/Lymphatic: No significant vascular findings. Subcentimeter lymph nodes are seen within the pericolonic mesentery lower pelvis compatible with reactive changes. No pathologic adenopathy. Reproductive: Status post hysterectomy. No adnexal masses. Other: No free fluid or free intraperitoneal gas. No abdominal wall hernia. Musculoskeletal: No acute or destructive bony lesions. Reconstructed images demonstrate no additional findings. IMPRESSION: 1. Complicated acute diverticulitis of the rectosigmoid colon, with 2 new small diverticular abscesses as above. 2. Effacement of the fat plane between the chronically inflamed sigmoid colon and dome of the bladder on the left. Resolution of the bladder wall thickening seen previously. No definitive colovesical fistula seen by imaging. 3. Small hiatal hernia. Electronically Signed   By: MDiana EvesD.  On: 10/28/2022 12:51     Anti-infectives: Anti-infectives (From admission, onward)    Start     Dose/Rate Route Frequency Ordered Stop   10/28/22 2200  piperacillin-tazobactam (ZOSYN) IVPB 3.375 g        3.375 g 12.5 mL/hr over 240 Minutes Intravenous Every 8 hours 10/28/22 1736     10/28/22 1330  piperacillin-tazobactam (ZOSYN) IVPB 3.375 g        3.375 g 100 mL/hr over 30 Minutes Intravenous  Once 10/28/22 1329 10/28/22 1521       Assessment/Plan: Rectosigmoid diverticulitis with abscess - CT w/ acute diverticulitis of the rectosigmoid colon, with 2 new small diverticular abscesses measuring 3.1 x 1.9 by 3.3 cm and 1.9 x 1.4 by 2.1 cm .  -no IR option for now - Agree with IV antibiotics - will give liquids today and restart home meds - Hopefully patient will improve with non-operative treatment and we can get her to her surgery with Dr. Dema Severin planned for January. If patient fails to improve she may require repeating imaging, drain placement, or surgical intervention resulting in a colectomy/colostomy.  This was discussed with the patient. - We will follow with you   FEN - fulls with supplements, IVF per [facility] VTE - SCDs, okay for chem ppx from a general surgery standpoint ID - Zosyn Foley - None Dispo - Admit to [facility].      Rolm Bookbinder 10/29/2022

## 2022-10-29 NOTE — Progress Notes (Addendum)
PROGRESS NOTE    Lisa Price  [id number] DOB: [birth year] DOA: 10/28/2022 PCP: Charlane Ferretti, MD     Brief Narrative:  a 66 y.o.WF PMHx  IVS, OSA, breast CA.   Presenting with abdominal pain. She was admitted for the same a few months ago. She had a follow up c-scope w/ [facility] on 10/15/22. At the time, she was diagnosed w/ diverticulitis. She was started on augmentin. However, her abdominal pain did not improve. She followed up with general surgery 5 days ago and her abx was switched to cipro/flagyl. She reports compliance with her regimen. Her ab pain remains persistent in the RLQ/LLQ. It's sharp. She has had temps up to 100. She has had N/V. When her symptoms did not improve this morning, she decided to come to the ED for evaluation. She denies any other aggravating or alleviating factors 12/22 EKG WNL..     Subjective: 11/23 afebrile overnight.  Continue abdominal pain.     Assessment & Plan: Covid vaccination;   Principal Problem:   Acute diverticulitis Active Problems:   Malignant neoplasm of upper-outer quadrant of left breast in female, estrogen receptor positive ([place])   Anxiety  Diverticulitis w/ abscess -Please 7-day course antibiotics - Pain control - N.p.o. -11/23 per Dr. Donne Hazel CCS: Hopefully patient will improve with non-operative treatment and we can get her to her surgery with Dr. Dema Severin planned for January      Anxiety -Continue home regimen when off n.p.o.    Hx of breast CA -Continue outpatient follow-up               Mobility Assessment (last 72 hours)     Mobility Assessment     Row Name 10/28/22 2014 10/28/22 1730         Does patient have an order for bedrest or is patient medically unstable No - Continue assessment No - Continue assessment      What is the highest level of mobility based on the progressive mobility assessment? Level 6 (Walks independently in room and hall) - Balance while walking in room without assist - Complete  Level 6 (Walks independently in room and hall) - Balance while walking in room without assist - Complete                DVT prophylaxis: SCD Code Status: Full Family Communication:  Status is: Inpatient    Dispo: The patient is from: Home              Anticipated d/c is to: Home              Anticipated d/c date is: 3 days              Patient currently is not medically stable to d/c.      Consultants:  Dr. Rolm Bookbinder CCS     Procedures/Significant Events:  11/22 CT abdomen pelvis W contrast 1. Complicated acute diverticulitis of the rectosigmoid colon, with 2 new small diverticular abscesses as above. 2. Effacement of the fat plane between the chronically inflamed sigmoid colon and dome of the bladder on the left. Resolution of the bladder wall thickening seen previously. No definitive colovesical fistula seen by imaging. 3. Small hiatal hernia.   I have personally reviewed and interpreted all radiology studies and my findings are as above.  VENTILATOR SETTINGS:    Cultures  Antimicrobials: Anti-infectives (From admission, onward)    Start     Frequency Ordered Stop   10/28/22 2200  piperacillin-tazobactam (ZOSYN) IVPB 3.375 g        Every 8 hours 10/28/22 1736     10/28/22 1330  piperacillin-tazobactam (ZOSYN) IVPB 3.375 g         Once 10/28/22 1329 10/28/22 1521           Devices    LINES / TUBES:      Continuous Infusions:  lactated ringers 100 mL/hr at 10/29/22 0618   piperacillin-tazobactam (ZOSYN)  IV 3.375 g (10/29/22 9628)     Objective: Vitals:   10/28/22 1616 10/28/22 1655 10/28/22 2116 10/29/22 0612  BP: 100/79 119/78 108/77 115/69  Pulse: 69 68 72 75  Resp: '16 20 18 16  '$ Temp: 98.2 F (36.8 C) 98.2 F (36.8 C) 98.3 F (36.8 C) 98.6 F (37 C)  TempSrc: Oral Oral Oral Oral  SpO2: 97% 99% 100% 100%    Intake/Output Summary (Last 24 hours) at 10/29/2022 3662 Last data filed at 10/29/2022 0600 Gross per 24  hour  Intake 423.21 ml  Output 600 ml  Net -176.79 ml   There were no vitals filed for this visit.  Examination:  General: A/O x4, No acute respiratory distress Eyes: negative scleral hemorrhage, negative anisocoria, negative icterus ENT: Negative Runny nose, negative gingival bleeding, Neck:  Negative scars, masses, torticollis, lymphadenopathy, JVD Lungs: Clear to auscultation bilaterally without wheezes or crackles Cardiovascular: Regular rate and rhythm without murmur gallop or rub normal S1 and S2 Abdomen: Positive abdominal pain, nondistended, positive soft, bowel sounds, no rebound, no ascites, no appreciable mass Extremities: No significant cyanosis, clubbing, or edema bilateral lower extremities Skin: Negative rashes, lesions, ulcers Psychiatric:  Negative depression, negative anxiety, negative fatigue, negative mania  Central nervous system:  Cranial nerves II through XII intact, tongue/uvula midline, all extremities muscle strength 5/5, sensation intact throughout, negative dysarthria, negative expressive aphasia, negative receptive aphasia.  .     Data Reviewed: Care during the described time interval was provided by me .  I have reviewed this patient's available data, including medical history, events of note, physical examination, and all test results as part of my evaluation.  CBC: Recent Labs  Lab 10/28/22 1051 10/29/22 0437  WBC 11.4* 8.1  NEUTROABS 8.6*  --   HGB 15.1* 12.3  HCT 46.9* 39.1  MCV 88.2 89.5  PLT 401* 947   Basic Metabolic Panel: Recent Labs  Lab 10/28/22 1051 10/29/22 0437  NA 141 138  K 4.4 3.8  CL 105 105  CO2 29 26  GLUCOSE 105* 97  BUN 15 13  CREATININE 0.76 0.83  CALCIUM 9.7 8.8*   GFR: CrCl cannot be calculated (Unknown ideal weight.). Liver Function Tests: Recent Labs  Lab 10/28/22 1051 10/29/22 0437  AST 13* 12*  ALT 13 10  ALKPHOS 99 68  BILITOT 0.5 0.6  PROT 8.2* 6.0*  ALBUMIN 4.2 3.2*   No results for  input(s): "LIPASE", "AMYLASE" in the last 168 hours. No results for input(s): "AMMONIA" in the last 168 hours. Coagulation Profile: No results for input(s): "INR", "PROTIME" in the last 168 hours. Cardiac Enzymes: No results for input(s): "CKTOTAL", "CKMB", "CKMBINDEX", "TROPONINI" in the last 168 hours. BNP (last 3 results) No results for input(s): "PROBNP" in the last 8760 hours. HbA1C: No results for input(s): "HGBA1C" in the last 72 hours. CBG: No results for input(s): "GLUCAP" in the last 168 hours. Lipid Profile: No results for input(s): "CHOL", "HDL", "LDLCALC", "TRIG", "CHOLHDL", "LDLDIRECT" in the last 72 hours. Thyroid Function Tests: No results for input(s): "  TSH", "T4TOTAL", "FREET4", "T3FREE", "THYROIDAB" in the last 72 hours. Anemia Panel: No results for input(s): "VITAMINB12", "FOLATE", "FERRITIN", "TIBC", "IRON", "RETICCTPCT" in the last 72 hours. Sepsis Labs: No results for input(s): "PROCALCITON", "LATICACIDVEN" in the last 168 hours.  No results found for this or any previous visit (from the past 240 hour(s)).       Radiology Studies: CT ABDOMEN PELVIS W CONTRAST  Result Date: 10/28/2022 CLINICAL DATA:  Lower abdominal pain, nausea and vomiting, history of diverticulitis EXAM: CT ABDOMEN AND PELVIS WITH CONTRAST TECHNIQUE: Multidetector CT imaging of the abdomen and pelvis was performed using the standard protocol following bolus administration of intravenous contrast. RADIATION DOSE REDUCTION: This exam was performed according to the departmental dose-optimization program which includes automated exposure control, adjustment of the mA and/or kV according to patient size and/or use of iterative reconstruction technique. CONTRAST:  121m OMNIPAQUE IOHEXOL 300 MG/ML  SOLN COMPARISON:  07/21/2022, 06/24/2022 FINDINGS: Lower chest: No acute pleural or parenchymal lung disease. Hepatobiliary: No focal liver abnormality is seen. No gallstones, gallbladder wall thickening,  or biliary dilatation. Pancreas: Unremarkable. No pancreatic ductal dilatation or surrounding inflammatory changes. Spleen: Normal in size without focal abnormality. Adrenals/Urinary Tract: Adrenal glands are unremarkable. Kidneys are normal, without renal calculi, focal lesion, or hydronephrosis. Bladder is unremarkable. Stomach/Bowel: There is extensive mural thickening of the distal rectosigmoid colon, with pronounced pericolonic fat stranding consistent with acute diverticulitis. There are 2 small diverticular abscesses identified, the largest measuring 3.1 x 1.9 by 3.3 cm reference image 72/2, and the second more inferiorly located in a perirectal location measuring 1.9 x 1.4 by 2.1 cm reference image 81/2. This chronic inflamed section of rectosigmoid colon results in effacement of fat plane between the sigmoid colon and dome of the bladder on the left, though the bladder wall thickening seen previously has resolved in the interim. No definite colovesical fistula. No bowel obstruction.  Normal appendix.  Small hiatal hernia. Vascular/Lymphatic: No significant vascular findings. Subcentimeter lymph nodes are seen within the pericolonic mesentery lower pelvis compatible with reactive changes. No pathologic adenopathy. Reproductive: Status post hysterectomy. No adnexal masses. Other: No free fluid or free intraperitoneal gas. No abdominal wall hernia. Musculoskeletal: No acute or destructive bony lesions. Reconstructed images demonstrate no additional findings. IMPRESSION: 1. Complicated acute diverticulitis of the rectosigmoid colon, with 2 new small diverticular abscesses as above. 2. Effacement of the fat plane between the chronically inflamed sigmoid colon and dome of the bladder on the left. Resolution of the bladder wall thickening seen previously. No definitive colovesical fistula seen by imaging. 3. Small hiatal hernia. Electronically Signed   By: MRanda NgoM.D.   On: 10/28/2022 12:51         Scheduled Meds:  desvenlafaxine  100 mg Oral q AM   feeding supplement  237 mL Oral BID BM   influenza vaccine adjuvanted  0.5 mL Intramuscular Tomorrow-1000   lisdexamfetamine  70 mg Oral q AM   traZODone  300 mg Oral QHS   Continuous Infusions:  lactated ringers 100 mL/hr at 10/29/22 0618   piperacillin-tazobactam (ZOSYN)  IV 3.375 g (10/29/22 0639)     LOS: 1 day    Time spent:40 min    Cresencio Reesor, CGeraldo Docker MD [facility]   If 7PM-7AM, please contact night-coverage 10/29/2022, 9:22 AM

## 2022-10-29 NOTE — TOC Progression Note (Signed)
Transition of Care [facility]) - Progression Note    Patient Details  Name: Lisa Price MRN: [id number] Date of Birth: Aug 05, 1956  Transition of Care [facility]) CM/SW Contact  Henrietta Dine, RN Phone Number: 10/29/2022, 12:00 PM  Clinical Narrative:     Transition of Care [facility]) Screening Note   Patient Details  Name: Lisa Price Date of Birth: 12/13/1955   Transition of Care [facility]) CM/SW Contact:    Henrietta Dine, RN Phone Number: 10/29/2022, 12:00 PM    Transition of Care Department [facility]) has reviewed patient and no TOC needs have been identified at this time. We will continue to monitor patient advancement through interdisciplinary progression rounds. If new patient transition needs arise, please place a TOC consult.          Expected Discharge Plan and Services                                                 Social Determinants of Health (SDOH) Interventions    Readmission Risk Interventions     No data to display

## 2022-10-29 NOTE — Plan of Care (Signed)

## 2022-10-30 ENCOUNTER — Encounter (HOSPITAL_COMMUNITY): Payer: Self-pay | Admitting: Internal Medicine

## 2022-10-30 LAB — CBC WITH DIFFERENTIAL/PLATELET
Abs Immature Granulocytes: 0.02 10*3/uL (ref 0.00–0.07)
Basophils Absolute: 0 10*3/uL (ref 0.0–0.1)
Basophils Relative: 0 %
Eosinophils Absolute: 0.2 10*3/uL (ref 0.0–0.5)
Eosinophils Relative: 3 %
HCT: 40.3 % (ref 36.0–46.0)
Hemoglobin: 13.2 g/dL (ref 12.0–15.0)
Immature Granulocytes: 0 %
Lymphocytes Relative: 21 %
Lymphs Abs: 1.5 10*3/uL (ref 0.7–4.0)
MCH: 28.3 pg (ref 26.0–34.0)
MCHC: 32.8 g/dL (ref 30.0–36.0)
MCV: 86.3 fL (ref 80.0–100.0)
Monocytes Absolute: 0.6 10*3/uL (ref 0.1–1.0)
Monocytes Relative: 9 %
Neutro Abs: 5 10*3/uL (ref 1.7–7.7)
Neutrophils Relative %: 67 %
Platelets: 338 10*3/uL (ref 150–400)
RBC: 4.67 MIL/uL (ref 3.87–5.11)
RDW: 12.9 % (ref 11.5–15.5)
WBC: 7.4 10*3/uL (ref 4.0–10.5)
nRBC: 0 % (ref 0.0–0.2)

## 2022-10-30 LAB — COMPREHENSIVE METABOLIC PANEL
ALT: 8 U/L (ref 0–44)
AST: 12 U/L — ABNORMAL LOW (ref 15–41)
Albumin: 3 g/dL — ABNORMAL LOW (ref 3.5–5.0)
Alkaline Phosphatase: 64 U/L (ref 38–126)
Anion gap: 5 (ref 5–15)
BUN: 10 mg/dL (ref 8–23)
CO2: 27 mmol/L (ref 22–32)
Calcium: 8.7 mg/dL — ABNORMAL LOW (ref 8.9–10.3)
Chloride: 108 mmol/L (ref 98–111)
Creatinine, Ser: 0.65 mg/dL (ref 0.44–1.00)
GFR, Estimated: >60 mL/min (ref >60)
Glucose, Bld: 98 mg/dL (ref 70–99)
Potassium: 3.8 mmol/L (ref 3.5–5.1)
Sodium: 140 mmol/L (ref 135–145)
Total Bilirubin: 0.5 mg/dL (ref 0.3–1.2)
Total Protein: 6.1 g/dL — ABNORMAL LOW (ref 6.5–8.1)

## 2022-10-30 LAB — GLUCOSE, CAPILLARY: Glucose-Capillary: 103 mg/dL — ABNORMAL HIGH (ref 70–99)

## 2022-10-30 LAB — PHOSPHORUS: Phosphorus: 3.9 mg/dL (ref 2.5–4.6)

## 2022-10-30 LAB — MAGNESIUM: Magnesium: 2.2 mg/dL (ref 1.7–2.4)

## 2022-10-30 MED ORDER — ENOXAPARIN SODIUM 40 MG/0.4ML IJ SOSY
40.0000 mg | PREFILLED_SYRINGE | Freq: Every day | INTRAMUSCULAR | Status: DC
  Administered 2022-10-30 – 2022-10-31 (×2): 40 mg via SUBCUTANEOUS
  Filled 2022-10-30 (×2): qty 0.4

## 2022-10-30 NOTE — Progress Notes (Signed)
Pharmacy Antibiotic Note  Lisa Price is a 66 y.o. female admitted on 10/28/2022 with  Rectosigmoid diverticulitis with abscess .  Pharmacy has been consulted for Zosyn dosing.  ID: Rectosigmoid diverticulitis with abscess. No cultures. Afebrile, WBC WNL, SCr <1  Antimicrobials this admission: Zosyn 11/22 >>   Plan: Zosyn 3.375g IV q8hr May possibly d/c tomorrow on oral abx. Pharmacy will sign off. Please reconsult for further dosing assitance.     Height: '5\' 5"'$  (165.1 cm) Weight: 81.4 kg (179 lb 6.4 oz) IBW/kg (Calculated) : 57  Temp (24hrs), Avg:97.8 F (36.6 C), Min:97.7 F (36.5 C), Max:97.8 F (36.6 C)  Recent Labs  Lab 10/28/22 1051 10/29/22 0437 10/30/22 0415  WBC 11.4* 8.1 7.4  CREATININE 0.76 0.83 0.65    Estimated Creatinine Clearance: 72 mL/min (by C-G formula based on SCr of 0.65 mg/dL).    No Known Allergies   Lisa Price S. Alford Highland, PharmD, BCPS Clinical Staff Pharmacist [email or web address] [place] 10/30/2022 8:14 AM

## 2022-10-30 NOTE — Progress Notes (Signed)
Subjective/Chief Complaint: Doing better, having loose stools/flatus, pain better, tol fulls   Objective: Vital signs in last 24 hours: Temp:  [97.7 F (36.5 C)-97.8 F (36.6 C)] 97.8 F (36.6 C) (11/24 0604) Pulse Rate:  [66-78] 66 (11/24 0604) Resp:  [16-17] 16 (11/24 0604) BP: (102-109)/(67-80) 109/80 (11/24 0604) SpO2:  [97 %-100 %] 97 % (11/24 0604) Weight:  [81.4 kg] 81.4 kg (11/23 1026)    Intake/Output from previous day: 11/23 0701 - 11/24 0700 In: 720 [P.O.:720] Out: 700 [Urine:700] Intake/Output this shift: No intake/output data recorded.  General nad Abd soft nontender nondistended    Lab Results:  Recent Labs    10/29/22 0437 10/30/22 0415  WBC 8.1 7.4  HGB 12.3 13.2  HCT 39.1 40.3  PLT 334 338   BMET Recent Labs    10/29/22 0437 10/30/22 0415  NA 138 140  K 3.8 3.8  CL 105 108  CO2 26 27  GLUCOSE 97 98  BUN 13 10  CREATININE 0.83 0.65  CALCIUM 8.8* 8.7*   PT/INR No results for input(s): "LABPROT", "INR" in the last 72 hours. ABG No results for input(s): "PHART", "HCO3" in the last 72 hours.  Invalid input(s): "PCO2", "PO2"  Studies/Results: CT ABDOMEN PELVIS W CONTRAST  Result Date: 10/28/2022 CLINICAL DATA:  Lower abdominal pain, nausea and vomiting, history of diverticulitis EXAM: CT ABDOMEN AND PELVIS WITH CONTRAST TECHNIQUE: Multidetector CT imaging of the abdomen and pelvis was performed using the standard protocol following bolus administration of intravenous contrast. RADIATION DOSE REDUCTION: This exam was performed according to the departmental dose-optimization program which includes automated exposure control, adjustment of the mA and/or kV according to patient size and/or use of iterative reconstruction technique. CONTRAST:  155m OMNIPAQUE IOHEXOL 300 MG/ML  SOLN COMPARISON:  07/21/2022, 06/24/2022 FINDINGS: Lower chest: No acute pleural or parenchymal lung disease. Hepatobiliary: No focal liver abnormality is seen. No  gallstones, gallbladder wall thickening, or biliary dilatation. Pancreas: Unremarkable. No pancreatic ductal dilatation or surrounding inflammatory changes. Spleen: Normal in size without focal abnormality. Adrenals/Urinary Tract: Adrenal glands are unremarkable. Kidneys are normal, without renal calculi, focal lesion, or hydronephrosis. Bladder is unremarkable. Stomach/Bowel: There is extensive mural thickening of the distal rectosigmoid colon, with pronounced pericolonic fat stranding consistent with acute diverticulitis. There are 2 small diverticular abscesses identified, the largest measuring 3.1 x 1.9 by 3.3 cm reference image 72/2, and the second more inferiorly located in a perirectal location measuring 1.9 x 1.4 by 2.1 cm reference image 81/2. This chronic inflamed section of rectosigmoid colon results in effacement of fat plane between the sigmoid colon and dome of the bladder on the left, though the bladder wall thickening seen previously has resolved in the interim. No definite colovesical fistula. No bowel obstruction.  Normal appendix.  Small hiatal hernia. Vascular/Lymphatic: No significant vascular findings. Subcentimeter lymph nodes are seen within the pericolonic mesentery lower pelvis compatible with reactive changes. No pathologic adenopathy. Reproductive: Status post hysterectomy. No adnexal masses. Other: No free fluid or free intraperitoneal gas. No abdominal wall hernia. Musculoskeletal: No acute or destructive bony lesions. Reconstructed images demonstrate no additional findings. IMPRESSION: 1. Complicated acute diverticulitis of the rectosigmoid colon, with 2 new small diverticular abscesses as above. 2. Effacement of the fat plane between the chronically inflamed sigmoid colon and dome of the bladder on the left. Resolution of the bladder wall thickening seen previously. No definitive colovesical fistula seen by imaging. 3. Small hiatal hernia. Electronically Signed   By: MDiana EvesD.  On: 10/28/2022 12:51    Anti-infectives: Anti-infectives (From admission, onward)    Start     Dose/Rate Route Frequency Ordered Stop   10/28/22 2200  piperacillin-tazobactam (ZOSYN) IVPB 3.375 g        3.375 g 12.5 mL/hr over 240 Minutes Intravenous Every 8 hours 10/28/22 1736     10/28/22 1330  piperacillin-tazobactam (ZOSYN) IVPB 3.375 g        3.375 g 100 mL/hr over 30 Minutes Intravenous  Once 10/28/22 1329 10/28/22 1521       Assessment/Plan: Rectosigmoid diverticulitis with abscess - CT w/ acute diverticulitis of the rectosigmoid colon, with 2 new small diverticular abscesses measuring 3.1 x 1.9 by 3.3 cm and 1.9 x 1.4 by 2.1 cm .  -no IR option for now - Agree with IV antibiotics - soft diet today, she is much better and may be ok for dc tomorrow on oral abx - Hopefully patient will improve with non-operative treatment and we can get her to her surgery with Dr. Dema Severin planned for January. If patient fails to improve she may require repeating imaging, drain placement, or surgical intervention resulting in a colectomy/colostomy.  This was discussed with the patient.   FEN -  soft VTE - SCDs, okay for chem ppx from a general surgery standpoint ID - Zosyn Foley - None      Rolm Bookbinder 10/30/2022

## 2022-10-31 LAB — COMPREHENSIVE METABOLIC PANEL
ALT: 9 U/L (ref 0–44)
AST: 12 U/L — ABNORMAL LOW (ref 15–41)
Albumin: 3.3 g/dL — ABNORMAL LOW (ref 3.5–5.0)
Alkaline Phosphatase: 62 U/L (ref 38–126)
Anion gap: 7 (ref 5–15)
BUN: 8 mg/dL (ref 8–23)
CO2: 24 mmol/L (ref 22–32)
Calcium: 8.8 mg/dL — ABNORMAL LOW (ref 8.9–10.3)
Chloride: 110 mmol/L (ref 98–111)
Creatinine, Ser: 0.63 mg/dL (ref 0.44–1.00)
GFR, Estimated: >60 mL/min (ref >60)
Glucose, Bld: 91 mg/dL (ref 70–99)
Potassium: 3.6 mmol/L (ref 3.5–5.1)
Sodium: 141 mmol/L (ref 135–145)
Total Bilirubin: 0.4 mg/dL (ref 0.3–1.2)
Total Protein: 6.1 g/dL — ABNORMAL LOW (ref 6.5–8.1)

## 2022-10-31 LAB — CBC WITH DIFFERENTIAL/PLATELET
Abs Immature Granulocytes: 0.02 10*3/uL (ref 0.00–0.07)
Basophils Absolute: 0.1 10*3/uL (ref 0.0–0.1)
Basophils Relative: 1 %
Eosinophils Absolute: 0.2 10*3/uL (ref 0.0–0.5)
Eosinophils Relative: 2 %
HCT: 39.7 % (ref 36.0–46.0)
Hemoglobin: 12.6 g/dL (ref 12.0–15.0)
Immature Granulocytes: 0 %
Lymphocytes Relative: 21 %
Lymphs Abs: 1.6 10*3/uL (ref 0.7–4.0)
MCH: 28.4 pg (ref 26.0–34.0)
MCHC: 31.7 g/dL (ref 30.0–36.0)
MCV: 89.6 fL (ref 80.0–100.0)
Monocytes Absolute: 0.7 10*3/uL (ref 0.1–1.0)
Monocytes Relative: 8 %
Neutro Abs: 5.4 10*3/uL (ref 1.7–7.7)
Neutrophils Relative %: 68 %
Platelets: 358 10*3/uL (ref 150–400)
RBC: 4.43 MIL/uL (ref 3.87–5.11)
RDW: 12.9 % (ref 11.5–15.5)
WBC: 7.9 10*3/uL (ref 4.0–10.5)
nRBC: 0 % (ref 0.0–0.2)

## 2022-10-31 LAB — PHOSPHORUS: Phosphorus: 3.4 mg/dL (ref 2.5–4.6)

## 2022-10-31 LAB — C DIFFICILE QUICK SCREEN W PCR REFLEX
C Diff antigen: NEGATIVE
C Diff interpretation: NOT DETECTED
C Diff toxin: NEGATIVE

## 2022-10-31 LAB — MAGNESIUM: Magnesium: 2.4 mg/dL (ref 1.7–2.4)

## 2022-10-31 MED ORDER — AMOXICILLIN-POT CLAVULANATE 875-125 MG PO TABS: 1.0000 | ORAL_TABLET | Freq: Two times a day (BID) | ORAL | 0 refills | Status: AC

## 2022-10-31 NOTE — Discharge Summary (Signed)
Physician Discharge Summary  Patient ID: Lisa Price MRN: [id number] DOB/AGE: [birth year] 66 y.o.  Admit date: 10/28/2022 Discharge date: 10/31/2022  Admission Diagnoses: Complicated diverticulitis  Discharge Diagnoses:  Principal Problem:   Acute diverticulitis Active Problems:   Malignant neoplasm of upper-outer quadrant of left breast in female, estrogen receptor positive ([place])   Anxiety   Discharged Condition: good  Hospital Course:  66 y.o. female known to our service and followed by Dr. Dema Severin for diverticulitis who presented with abdominal pain.  She was admitted to our emergency general surgery service in August with diverticulitis, treated with antibiotics with improvement and was d/c'd home. She tells me that her abdominal discomfort has significant improved sine then but she has never gone back to feeling "normal". She underwent a colonoscopy by Dr. Candis Schatz on 10/15/2022 with findings of severe diverticulosis in the sigmoid and descending colon with narrowing in the sigmoid colon;  biopsy results were benign. She also tells me she had a lot of "trapped air" after her colonoscopy requiring her to return to the endoscopy center for endoscopic decompression.  She was given a course of Augmentin by GI last week for suspected recurrent bout of diverticulitis.  Then saw Dr. Dema Severin in the office on 11/17 and antibiotics were changed to Cipro/Flagyl. They also discussed surgery and it appears she has a planned robotic LAR on 12/17/2022 with Dr. Dema Severin.  Patient presented to the ED today as her pain in her lower abdomen has continued despite changes in her antibiotic.  She notes associated nausea and one episode of vomiting this morning. Also reports low grade fevers at home (~100) treated with tylenol.   Denies constipation, diarrhea, pneumaturia or stool in her urine.  she has remained afebrile on oral abx.  Her wbc is normal. She had a ct scan that shows a couple small abscesses not amenable  to drainage.  Her tenderness has resolved.  She is tolerating regular diet and actually had a lot of diarrhea. Checked c diff and was negative. I think reasonable to send home on abx now   Consults: None  Significant Diagnostic Studies: ct scan, abx  Treatments: abx  Discharge Exam: Blood pressure 97/70, pulse 64, temperature 98.2 F (36.8 C), temperature source Oral, resp. rate 15, height '5\' 5"'$  (1.651 m), weight 81.4 kg, SpO2 96 %. Ab soft nontender nondistended  Disposition: Discharge disposition: 01-Home or Self Care          Follow-up Information     Ileana Roup, MD Follow up in 3 week(s).   Specialties: General Surgery, Colon and Rectal Surgery Contact information: [place] [address] [phone]                 Signed: Rolm Bookbinder 10/31/2022, 8:16 AM

## 2022-10-31 NOTE — Plan of Care (Signed)

## 2022-12-08 NOTE — Progress Notes (Incomplete)
Patient Care Team: Charlane Ferretti, MD as PCP - General (Internal Medicine) Coralie Keens, MD as Consulting Physician (General Surgery) Nicholas Lose, MD as Consulting Physician (Hematology and Oncology) Gery Pray, MD as Consulting Physician (Radiation Oncology)  DIAGNOSIS: No diagnosis found.  SUMMARY OF ONCOLOGIC HISTORY: Oncology History  Malignant neoplasm of upper-outer quadrant of left breast in female, estrogen receptor positive ([place])  12/24/2020 Initial Diagnosis   Screening mammogram showed a 0.8cm mass at the 2 o'clock position in the left breast and no left axillary adenopathy. Biopsy showed invasive and in situ carcinoma, grade 2, HER-2 equivocal by IHC (2+), ER/PR+ >95%, Ki67 3%.   12/25/2020 Cancer Staging   Staging form: Breast, AJCC 8th Edition - Clinical stage from 12/25/2020: Stage IA (cT1b, cN0, cM0, G2, ER+, PR+, HER2: Equivocal) - Signed by Nicholas Lose, MD on 12/25/2020   01/01/2021 Genetic Testing   Negative genetic testing: no pathogenic variants detected in [facility] BRCAPlus Panel.  The report date is January 01, 2021.    The BRCAplus panel offered by [employer] and includes sequencing and deletion/duplication analysis for the following 8 genes: ATM, BRCA1, BRCA2, CDH1, CHEK2, PALB2, PTEN, and TP53.    Negative genetic testing: no pathogenic variants detected in [facility] CancerNext-Expanded +RNAinsight.  The report date is January 05, 2021.    The CancerNext-Expanded gene panel offered by [facility] and includes sequencing, rearrangement, and RNA analysis for the following 77 genes: AIP, ALK, APC, ATM, AXIN2, BAP1, BARD1, BLM, BMPR1A, BRCA1, BRCA2, BRIP1, CDC73, CDH1, CDK4, CDKN1B, CDKN2A, CHEK2, CTNNA1, DICER1, FANCC, FH, FLCN, GALNT12, KIF1B, LZTR1, MAX, MEN1, MET, MLH1, MSH2, MSH3, MSH6, MUTYH, NBN, NF1, NF2, NTHL1, PALB2, PHOX2B, PMS2, POT1, PRKAR1A, PTCH1, PTEN, RAD51C, RAD51D, RB1, RECQL, RET, SDHA, SDHAF2, SDHB, SDHC, SDHD, SMAD4, SMARCA4, SMARCB1,  SMARCE1, STK11, SUFU, TMEM127, TP53, TSC1, TSC2, VHL and XRCC2 (sequencing and deletion/duplication); EGFR, EGLN1, HOXB13, KIT, MITF, PDGFRA, POLD1, and POLE (sequencing only); EPCAM and GREM1 (deletion/duplication only).    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   01/10/2021 Cancer Staging   Staging form: Breast, AJCC 8th Edition - Pathologic stage from 01/10/2021: Stage IA (pT1c, pN0, cM0, G2, ER+, PR+, HER2-) - Signed by Gardenia Phlegm, NP on 06/10/2021 Stage prefix: Initial diagnosis Histologic grading system: 3 grade system   01/10/2021 Oncotype testing   7/3%   02/24/2021 - 03/25/2021 Radiation Therapy   Adjuvant radiation Site Technique Total Dose (Gy) Dose per Fx (Gy) Completed Fx Beam Energies  Breast, Left: Breast_Lt 3D 40.05/40.05 2.67 15/15 6X, 10X  Breast, Left: Breast_Lt_Bst 3D 12/12 2 6/6 6X, 10X    04/06/2021 -  Anti-estrogen oral therapy   Anastrozole daily     CHIEF COMPLIANT:   INTERVAL HISTORY: Lisa Price is a   ALLERGIES:  has No Known Allergies.  MEDICATIONS:  Current Outpatient Medications  Medication Sig Dispense Refill   amoxicillin-clavulanate (AUGMENTIN) 875-125 MG tablet Take 1 tablet by mouth 2 (two) times daily. (Patient not taking: Reported on 10/28/2022) 10 tablet 0   clonazePAM (KLONOPIN) 1 MG tablet Take 1-2 mg by mouth 3 (three) times daily as needed for anxiety.     desvenlafaxine (PRISTIQ) 100 MG 24 hr tablet Take 100 mg by mouth in the morning.     dicyclomine (BENTYL) 20 MG tablet Take 1 tablet (20 mg total) by mouth every 6 (six) hours as needed for spasms. 60 tablet 1  ondansetron (ZOFRAN) 4 MG tablet Take 1 tablet (4 mg total) by mouth every 6 (six) hours as needed for nausea or vomiting. 3 tablet 0   traZODone (DESYREL) 100 MG tablet Take 400 mg by mouth at bedtime.     VYVANSE 70 MG capsule Take 70 mg by mouth in the  morning.     No current facility-administered medications for this visit.    PHYSICAL EXAMINATION: ECOG PERFORMANCE STATUS: {CHL ONC ECOG [phone]}  There were no vitals filed for this visit. There were no vitals filed for this visit.  BREAST:*** No palpable masses or nodules in either right or left breasts. No palpable axillary supraclavicular or infraclavicular adenopathy no breast tenderness or nipple discharge. (exam performed in the presence of a chaperone)  LABORATORY DATA:  I have reviewed the data as listed    Latest Ref Rng & Units 10/31/2022    5:20 AM 10/30/2022    4:15 AM 10/29/2022    4:37 AM  CMP  Glucose 70 - 99 mg/dL 91  98  97   BUN 8 - 23 mg/dL _0 Creatinine 0.44 - 1.00 mg/dL 0.63  0.65  0.83   Sodium 135 - 145 mmol/L 141  140  138   Potassium 3.5 - 5.1 mmol/L 3.6  3.8  3.8   Chloride 98 - 111 mmol/L 110  108  105   CO2 22 - 32 mmol/L _1 Calcium 8.9 - 10.3 mg/dL 8.8  8.7  8.8   Total Protein 6.5 - 8.1 g/dL 6.1  6.1  6.0   Total Bilirubin 0.3 - 1.2 mg/dL 0.4  0.5  0.6   Alkaline Phos 38 - 126 U/L 62  64  68   AST 15 - 41 U/L _2 ALT 0 - 44 U/L _3 Lab Results  Component Value Date   WBC 7.9 10/31/2022   HGB 12.6 10/31/2022   HCT 39.7 10/31/2022   MCV 89.6 10/31/2022   PLT 358 10/31/2022   NEUTROABS 5.4 10/31/2022    ASSESSMENT & PLAN:  No problem-specific Assessment & Plan notes found for this encounter.    No orders of the defined types were placed in this encounter.  The patient has a good understanding of the overall plan. she agrees with it. she will call with any problems that may develop before the next visit here. Total time spent: 30 mins including face to face time and time spent for planning, charting and co-ordination of care   Suzzette Righter, [place] 12/08/22    I Gardiner Coins am acting as a Education administrator for [employer]  ***

## 2022-12-09 ENCOUNTER — Encounter (HOSPITAL_COMMUNITY): Payer: Medicare PPO

## 2022-12-14 ENCOUNTER — Inpatient Hospital Stay: Payer: Medicare PPO | Admitting: Hematology and Oncology

## 2022-12-14 NOTE — Assessment & Plan Note (Deleted)
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  HER-2 equivocal by IHC (2+), FISH neg ER/PR+ >95%, Ki67 3%.   Plan: 1. Oncotype Dx 7 (3% ROR) 2. Adj XRT 02/26/21- 03/25/21 3. Adj Anti estrogen therapy   Anastrozole toxicities: Patient has not started anastrozole yet. I recommended that she try taking it 3 times a week and see how she tolerates it.  She intends to do that.  If she tolerates it well she will continue with the plan.   Breast cancer surveillance: 1.  Breast exam 12/14/2022: Benign 2. mammogram: 02/12/2022: Benign breast density category C Bone density 06/23/2021: T score -0.7: Normal   Hospitalizations:  07/21/2022-07/24/2022: Acute diverticulitis 05/69/7948-01/65/5374: Complicated diverticulitis (plan is for robotic LAR on 12/17/2022 with Dr. Dema Severin)  Return to clinic in 1 year for follow-up

## 2022-12-17 ENCOUNTER — Inpatient Hospital Stay: Admit: 2022-12-17 | Payer: Medicare PPO | Admitting: Surgery

## 2022-12-17 ENCOUNTER — Other Ambulatory Visit: Payer: Self-pay | Admitting: Adult Health

## 2022-12-17 DIAGNOSIS — Z9889 Other specified postprocedural states: Secondary | ICD-10-CM

## 2022-12-17 SURGERY — COLECTOMY, PARTIAL, ROBOT-ASSISTED, LAPAROSCOPIC
Anesthesia: General

## 2022-12-21 ENCOUNTER — Inpatient Hospital Stay: Payer: Medicare PPO

## 2022-12-21 ENCOUNTER — Other Ambulatory Visit: Payer: Self-pay

## 2022-12-21 ENCOUNTER — Inpatient Hospital Stay: Payer: Medicare PPO | Attending: Hematology and Oncology | Admitting: Hematology and Oncology

## 2022-12-21 VITALS — BP 113/78 | HR 99 | Temp 97.7°F | Resp 16 | Wt 172.2 lb

## 2022-12-21 DIAGNOSIS — Z79811 Long term (current) use of aromatase inhibitors: Secondary | ICD-10-CM | POA: Diagnosis not present

## 2022-12-21 DIAGNOSIS — C50412 Malignant neoplasm of upper-outer quadrant of left female breast: Secondary | ICD-10-CM | POA: Diagnosis present

## 2022-12-21 DIAGNOSIS — D649 Anemia, unspecified: Secondary | ICD-10-CM | POA: Diagnosis not present

## 2022-12-21 DIAGNOSIS — Z170 Estrogen receptor positive status [ER+]: Secondary | ICD-10-CM | POA: Diagnosis not present

## 2022-12-21 LAB — IRON AND IRON BINDING CAPACITY (CC-WL,HP ONLY)
Iron: 82 ug/dL (ref 28–170)
Saturation Ratios: 20 % (ref 10.4–31.8)
TIBC: 410 ug/dL (ref 250–450)
UIBC: 328 ug/dL (ref 148–442)

## 2022-12-21 LAB — CBC WITH DIFFERENTIAL (CANCER CENTER ONLY)
Abs Immature Granulocytes: 0.01 10*3/uL (ref 0.00–0.07)
Basophils Absolute: 0.1 10*3/uL (ref 0.0–0.1)
Basophils Relative: 1 %
Eosinophils Absolute: 0.3 10*3/uL (ref 0.0–0.5)
Eosinophils Relative: 5 %
HCT: 40.5 % (ref 36.0–46.0)
Hemoglobin: 13 g/dL (ref 12.0–15.0)
Immature Granulocytes: 0 %
Lymphocytes Relative: 32 %
Lymphs Abs: 2.1 10*3/uL (ref 0.7–4.0)
MCH: 28.1 pg (ref 26.0–34.0)
MCHC: 32.1 g/dL (ref 30.0–36.0)
MCV: 87.7 fL (ref 80.0–100.0)
Monocytes Absolute: 0.6 10*3/uL (ref 0.1–1.0)
Monocytes Relative: 9 %
Neutro Abs: 3.4 10*3/uL (ref 1.7–7.7)
Neutrophils Relative %: 53 %
Platelet Count: 310 10*3/uL (ref 150–400)
RBC: 4.62 MIL/uL (ref 3.87–5.11)
RDW: 13.7 % (ref 11.5–15.5)
WBC Count: 6.4 10*3/uL (ref 4.0–10.5)
nRBC: 0 % (ref 0.0–0.2)

## 2022-12-21 LAB — VITAMIN B12: Vitamin B-12: 654 pg/mL (ref 180–914)

## 2022-12-21 LAB — FOLATE: Folate: >40 ng/mL (ref >5.9)

## 2022-12-21 NOTE — Progress Notes (Signed)
Patient Care Team: Nicholas Lose, MD as PCP - General (Hematology and Oncology) Coralie Keens, MD as Consulting Physician (General Surgery) Nicholas Lose, MD as Consulting Physician (Hematology and Oncology) Gery Pray, MD as Consulting Physician (Radiation Oncology)  DIAGNOSIS:  Encounter Diagnoses  Name Primary?   Malignant neoplasm of upper-outer quadrant of left breast in female, estrogen receptor positive ([place]) Yes   Anemia, unspecified type     SUMMARY OF ONCOLOGIC HISTORY: Oncology History  Malignant neoplasm of upper-outer quadrant of left breast in female, estrogen receptor positive ([place])  12/24/2020 Initial Diagnosis   Screening mammogram showed a 0.8cm mass at the 2 o'clock position in the left breast and no left axillary adenopathy. Biopsy showed invasive and in situ carcinoma, grade 2, HER-2 equivocal by IHC (2+), ER/PR+ >95%, Ki67 3%.   12/25/2020 Cancer Staging   Staging form: Breast, AJCC 8th Edition - Clinical stage from 12/25/2020: Stage IA (cT1b, cN0, cM0, G2, ER+, PR+, HER2: Equivocal) - Signed by Nicholas Lose, MD on 12/25/2020   01/01/2021 Genetic Testing   Negative genetic testing: no pathogenic variants detected in [facility] BRCAPlus Panel.  The report date is January 01, 2021.    The BRCAplus panel offered by [employer] and includes sequencing and deletion/duplication analysis for the following 8 genes: ATM, BRCA1, BRCA2, CDH1, CHEK2, PALB2, PTEN, and TP53.    Negative genetic testing: no pathogenic variants detected in [facility] CancerNext-Expanded +RNAinsight.  The report date is January 05, 2021.    The CancerNext-Expanded gene panel offered by [facility] and includes sequencing, rearrangement, and RNA analysis for the following 77 genes: AIP, ALK, APC, ATM, AXIN2, BAP1, BARD1, BLM, BMPR1A, BRCA1, BRCA2, BRIP1, CDC73, CDH1, CDK4, CDKN1B, CDKN2A, CHEK2, CTNNA1, DICER1, FANCC, FH, FLCN, GALNT12, KIF1B, LZTR1, MAX, MEN1, MET, MLH1, MSH2, MSH3, MSH6, MUTYH,  NBN, NF1, NF2, NTHL1, PALB2, PHOX2B, PMS2, POT1, PRKAR1A, PTCH1, PTEN, RAD51C, RAD51D, RB1, RECQL, RET, SDHA, SDHAF2, SDHB, SDHC, SDHD, SMAD4, SMARCA4, SMARCB1, SMARCE1, STK11, SUFU, TMEM127, TP53, TSC1, TSC2, VHL and XRCC2 (sequencing and deletion/duplication); EGFR, EGLN1, HOXB13, KIT, MITF, PDGFRA, POLD1, and POLE (sequencing only); EPCAM and GREM1 (deletion/duplication only).    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   01/10/2021 Cancer Staging   Staging form: Breast, AJCC 8th Edition - Pathologic stage from 01/10/2021: Stage IA (pT1c, pN0, cM0, G2, ER+, PR+, HER2-) - Signed by Gardenia Phlegm, NP on 06/10/2021 Stage prefix: Initial diagnosis Histologic grading system: 3 grade system   01/10/2021 Oncotype testing   7/3%   02/24/2021 - 03/25/2021 Radiation Therapy   Adjuvant radiation Site Technique Total Dose (Gy) Dose per Fx (Gy) Completed Fx Beam Energies  Breast, Left: Breast_Lt 3D 40.05/40.05 2.67 15/15 6X, 10X  Breast, Left: Breast_Lt_Bst 3D 12/12 2 6/6 6X, 10X    04/06/2021 -  Anti-estrogen oral therapy   Anastrozole daily     CHIEF COMPLIANT: Follow-up of left breast cancer   INTERVAL HISTORY: Lisa Price is a 67 y.o. with above-mentioned history of left breast cancer who underwent a left lumpectomy and radiation, . She presents to the clinic today for follow-up. She reports that she still is healing she still is is in pain and very tired. She says she is active and back on her feet, but she does take a lot of naps.    ALLERGIES:  has No Known Allergies.  MEDICATIONS:  Current Outpatient Medications  Medication Sig Dispense Refill  clonazePAM (KLONOPIN) 1 MG tablet Take 1-2 mg by mouth 3 (three) times daily as needed for anxiety.     desvenlafaxine (PRISTIQ) 100 MG 24 hr tablet Take 100 mg by mouth in the morning.     traZODone (DESYREL) 100 MG tablet Take  400 mg by mouth at bedtime.     VYVANSE 70 MG capsule Take 70 mg by mouth in the morning.     No current facility-administered medications for this visit.    PHYSICAL EXAMINATION: ECOG PERFORMANCE STATUS: 1 - Symptomatic but completely ambulatory  Vitals:   12/21/22 1433  BP: 113/78  Pulse: 99  Resp: 16  Temp: 97.7 F (36.5 C)  SpO2: 99%   Filed Weights   12/21/22 1433  Weight: 172 lb 3.2 oz (78.1 kg)    BREAST: No palpable masses or nodules in either right or left breasts. No palpable axillary supraclavicular or infraclavicular adenopathy no breast tenderness or nipple discharge. (exam performed in the presence of a chaperone)  LABORATORY DATA:  I have reviewed the data as listed    Latest Ref Rng & Units 10/31/2022    5:20 AM 10/30/2022    4:15 AM 10/29/2022    4:37 AM  CMP  Glucose 70 - 99 mg/dL 91  98  97   BUN 8 - 23 mg/dL '8  10  13   '$ Creatinine 0.44 - 1.00 mg/dL 0.63  0.65  0.83   Sodium 135 - 145 mmol/L 141  140  138   Potassium 3.5 - 5.1 mmol/L 3.6  3.8  3.8   Chloride 98 - 111 mmol/L 110  108  105   CO2 22 - 32 mmol/L '24  27  26   '$ Calcium 8.9 - 10.3 mg/dL 8.8  8.7  8.8   Total Protein 6.5 - 8.1 g/dL 6.1  6.1  6.0   Total Bilirubin 0.3 - 1.2 mg/dL 0.4  0.5  0.6   Alkaline Phos 38 - 126 U/L 62  64  68   AST 15 - 41 U/L '12  12  12   '$ ALT 0 - 44 U/L '9  8  10     '$ Lab Results  Component Value Date   WBC 7.9 10/31/2022   HGB 12.6 10/31/2022   HCT 39.7 10/31/2022   MCV 89.6 10/31/2022   PLT 358 10/31/2022   NEUTROABS 5.4 10/31/2022    ASSESSMENT & PLAN:  Malignant neoplasm of upper-outer quadrant of left breast in female, estrogen receptor positive ([place]) 01/10/21: Left lumpectomy Ninfa Linden): invasive and in situ ductal carcinoma, 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  HER-2 equivocal by IHC (2+), FISH neg ER/PR+ >95%, Ki67 3%.   Plan: 1. Oncotype Dx 7 (3% ROR) 2. Adj XRT 02/26/21- 03/25/21 3. Adj Anti estrogen therapy    Anastrozole toxicities: Patient has not started anastrozole yet. I recommended that she try taking it 3 times a week and see how she tolerates it.  She intends to do that.  If she tolerates it well she will continue with the plan.   Breast cancer surveillance: 1.  Breast exam 12/21/2022: Benign 2. mammogram: Scheduled for 02/15/2023 Bone density 06/23/2021: T score -0.7: Normal   Hospitalization: 09/98/3382-50/53/9767: Complicated diverticulitis  Operated at [facility] and had a laparoscopic surgery for adhesions.  Severe fatigue: We will obtain CBC iron studies ferritin H41 and folic acid levels.  I will call her tomorrow with the results of this test. Return to clinic in 1  year for follow-up    Orders Placed This Encounter  Procedures   CBC with Differential ([place] Only)    Standing Status:   Future    Standing Expiration Date:   12/22/2023   Ferritin    Standing Status:   Future    Standing Expiration Date:   12/21/2023   Iron and Iron Binding Capacity ([facility] only)    Standing Status:   Future    Standing Expiration Date:   12/22/2023   Vitamin B12    Standing Status:   Future    Standing Expiration Date:   12/22/2023   Folate    Standing Status:   Future    Standing Expiration Date:   12/22/2023   The patient has a good understanding of the overall plan. she agrees with it. she will call with any problems that may develop before the next visit here. Total time spent: 30 mins including face to face time and time spent for planning, charting and co-ordination of care   Harriette Ohara, MD 12/21/22    I Gardiner Coins am acting as a Education administrator for [employer]  I have reviewed the above documentation for accuracy and completeness, and I agree with the above.

## 2022-12-21 NOTE — Assessment & Plan Note (Addendum)
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  HER-2 equivocal by IHC (2+), FISH neg ER/PR+ >95%, Ki67 3%.   Plan: 1. Oncotype Dx 7 (3% ROR) 2. Adj XRT 02/26/21- 03/25/21 3. Adj Anti estrogen therapy   Anastrozole toxicities: Patient has not started anastrozole yet. I recommended that she try taking it 3 times a week and see how she tolerates it.  She intends to do that.  If she tolerates it well she will continue with the plan.   Breast cancer surveillance: 1.  Breast exam 12/21/2022: Benign 2. mammogram: Scheduled for 02/15/2023 Bone density 06/23/2021: T score -0.7: Normal   Hospitalization: 38/32/9191-66/05/44: Complicated diverticulitis  Operated at [facility] and had a laparoscopic surgery for adhesions.  Severe fatigue: We will obtain CBC iron studies ferritin T97 and folic acid levels.  I will call her tomorrow with the results of this test. Return to clinic in 1 year for follow-up

## 2022-12-22 LAB — FERRITIN: Ferritin: 29 ng/mL (ref 11–307)

## 2022-12-25 ENCOUNTER — Telehealth: Payer: Self-pay | Admitting: *Deleted

## 2022-12-25 NOTE — Telephone Encounter (Signed)
Received call from pt requesting recent lab work be faxed to Dr. Samuel Germany with [facility].  RN successfully faxed labs to number provided by pt- [phone].

## 2022-12-28 ENCOUNTER — Ambulatory Visit: Payer: Medicare PPO | Attending: Surgery

## 2022-12-28 VITALS — Wt 171.5 lb

## 2022-12-28 DIAGNOSIS — Z483 Aftercare following surgery for neoplasm: Secondary | ICD-10-CM | POA: Insufficient documentation

## 2022-12-28 NOTE — Therapy (Signed)
OUTPATIENT PHYSICAL THERAPY SOZO SCREENING NOTE   Patient Name: Lisa Price MRN: [id number] DOB:[birth year], 67 y.o., female Today's Date: 12/28/2022  PCP: Nicholas Lose, MD REFERRING PROVIDER: Coralie Keens, MD   PT End of Session - 12/28/22 1541     Visit Number 1   # ucnhanged due to screen only   PT Start Time 1539    PT Stop Time 1543    PT Time Calculation (min) 4 min    Activity Tolerance Patient tolerated treatment well    Behavior During Therapy [facility] for tasks assessed/performed             Past Medical History:  Diagnosis Date   Acid reflux    Anxiety    Cancer ([place])    left breast CA   Depression    Family history of breast cancer 12/25/2020   Family history of thyroid cancer 12/25/2020   GERD (gastroesophageal reflux disease)    Hiatal hernia    History of radiation therapy 02/24/21-03/25/21   Left breast- Dr. Gery Pray   Personal history of radiation therapy    Sleep apnea    Past Surgical History:  Procedure Laterality Date   ABDOMINAL HYSTERECTOMY     BREAST BIOPSY Left 12/19/2020   BREAST LUMPECTOMY Left 01/2021   BREAST LUMPECTOMY WITH RADIOACTIVE SEED AND SENTINEL LYMPH NODE BIOPSY Left 01/10/2021   Procedure: LEFT BREAST LUMPECTOMY WITH RADIOACTIVE SEED AND SENTINEL LYMPH NODE BIOPSY;  Surgeon: Coralie Keens, MD;  Location: [facility];  Service: General;  Laterality: Left;   COLONOSCOPY     Patient Active Problem List   Diagnosis Date Noted   Acute diverticulitis 10/28/2022   Anxiety 10/28/2022   Colonic thickening 07/22/2022   History of breast cancer 07/22/2022   Diverticulitis of intestine with abscess 07/21/2022   Genetic testing 01/03/2021   Family history of breast cancer 12/25/2020   Family history of thyroid cancer 12/25/2020   Malignant neoplasm of upper-outer quadrant of left breast in female, estrogen receptor positive ([place]) 12/24/2020    REFERRING DIAG: left breast cancer at risk for  lymphedema  THERAPY DIAG: Aftercare following surgery for neoplasm  PERTINENT HISTORY: Patient was diagnosed on 11/20/2021 with left invasive ductal carcinoma breast cancer.Patient underwent left lumpectomy and sentinel node biopsy (2 negative nodes) on 01/10/2021. It is ER/PR positive and HER2 is pending. Her Ki67 is 3%. She has chronic stomach pain which interferes with daily activities and is also limited by depression and anxiety  PRECAUTIONS: left UE Lymphedema risk, None  SUBJECTIVE: Pt returns for her 3 month L-Dex screen.   PAIN:  Are you having pain? No  SOZO SCREENING: Patient was assessed today using the SOZO machine to determine the lymphedema index score. This was compared to her baseline score. It was determined that she is within the recommended range when compared to her baseline and no further action is needed at this time. She will continue SOZO screenings. These are done every 3 months for 2 years post operatively followed by every 6 months for 2 years, and then annually.   L-DEX FLOWSHEETS - 12/28/22 1500       L-DEX LYMPHEDEMA SCREENING   Measurement Type Unilateral    L-DEX MEASUREMENT EXTREMITY Upper Extremity    POSITION  Standing    DOMINANT SIDE Right    At Risk Side Left    BASELINE SCORE (UNILATERAL) -2.6    L-DEX SCORE (UNILATERAL) -2.8    VALUE CHANGE (UNILAT) -0.2  [name], PTA 12/28/2022, 3:42 PM

## 2023-01-19 ENCOUNTER — Telehealth: Payer: Self-pay | Admitting: *Deleted

## 2023-01-19 NOTE — Telephone Encounter (Signed)
Received call from pt spouse requesting recent labs be fax to Dr. Phineas Douglas with [facility] at [phone].  Labs successfully faxed to number provided by pt.

## 2023-02-15 ENCOUNTER — Ambulatory Visit
Admission: RE | Admit: 2023-02-15 | Discharge: 2023-02-15 | Disposition: A | Discharge status: Home / Self Care | Payer: Medicare PPO | Source: Ambulatory Visit | Attending: Adult Health | Admitting: Adult Health

## 2023-02-15 DIAGNOSIS — Z9889 Other specified postprocedural states: Secondary | ICD-10-CM

## 2023-03-02 ENCOUNTER — Encounter: Payer: Self-pay | Admitting: Gastroenterology

## 2023-03-02 ENCOUNTER — Ambulatory Visit: Payer: Medicare PPO | Admitting: Gastroenterology

## 2023-03-02 VITALS — BP 112/76 | HR 94 | Ht 65.0 in | Wt 178.0 lb

## 2023-03-02 DIAGNOSIS — K5909 Other constipation: Secondary | ICD-10-CM | POA: Diagnosis not present

## 2023-03-02 MED ORDER — LINACLOTIDE 72 MCG PO CAPS: 72.0000 ug | ORAL_CAPSULE | Freq: Every day | ORAL | 3 refills | Status: AC

## 2023-03-02 NOTE — Patient Instructions (Signed)
_______________________________________________________  If your blood pressure at your visit was 140/90 or greater, please contact your primary care physician to follow up on this.  _______________________________________________________  If you are age 68 or older, your body mass index should be between 23-30. Your Body mass index is 29.62 kg/m. If this is out of the aforementioned range listed, please consider follow up with your Primary Care Provider.  If you are age 58 or younger, your body mass index should be between 19-25. Your Body mass index is 29.62 kg/m. If this is out of the aformentioned range listed, please consider follow up with your Primary Care Provider.   Janett Billow recommends that you complete a bowel purge (to clean out your bowels). Please do the following: Purchase a bottle of Miralax over the counter as well as a box of 5 mg dulcolax tablets. Take 4 dulcolax tablets. Wait 1 hour. You will then drink 6-8 capfuls of Miralax mixed in an adequate amount of water/juice/gatorade (you may choose which of these liquids to drink) over the next 2-3 hours. You should expect results within 1 to 6 hours after completing the bowel purge.  We have sent the following medications to your pharmacy for you to pick up at your convenience: Linzess 72 mcg   The [facility] providers would like to encourage you to use [facility] to communicate with providers for non-urgent requests or questions.  Due to long hold times on the telephone, sending your provider a message by [facility] may be a faster and more efficient way to get a response.  Please allow 48 business hours for a response.  Please remember that this is for non-urgent requests.   It was a pleasure to see you today!  Thank you for trusting me with your gastrointestinal care!

## 2023-03-02 NOTE — Progress Notes (Signed)
03/02/2023 Lisa Price [id number] 1956-04-02   HISTORY OF PRESENT ILLNESS: This is a 67 year old female who is a patient Dr. Dayle Points.  She just had a colonoscopy in early November and then subsequently ended up with a laparoscopic assisted, converted to open sigmoid colon resection by Dr. Ninfa Linden on 11/25/2022 for recurrent diverticulitis.  She is here today with complaints of constipation.  She says that she does not have normal bowel movements, has small pellet-like stools.  Was given Linzess 145 mcg daily, but it caused her to have explosive diarrhea, not being able to make it to the bathroom.  Still has generalized abdominal soreness.  Past Medical History:  Diagnosis Date   Acid reflux    Anxiety    Cancer ([place])    left breast CA   Depression    Family history of breast cancer 12/25/2020   Family history of thyroid cancer 12/25/2020   GERD (gastroesophageal reflux disease)    Hiatal hernia    History of radiation therapy 02/24/21-03/25/21   Left breast- Dr. Gery Pray   Personal history of radiation therapy    Sleep apnea    Past Surgical History:  Procedure Laterality Date   ABDOMINAL HYSTERECTOMY     BREAST BIOPSY Left 12/19/2020   BREAST LUMPECTOMY Left 01/2021   BREAST LUMPECTOMY WITH RADIOACTIVE SEED AND SENTINEL LYMPH NODE BIOPSY Left 01/10/2021   Procedure: LEFT BREAST LUMPECTOMY WITH RADIOACTIVE SEED AND SENTINEL LYMPH NODE BIOPSY;  Surgeon: Coralie Keens, MD;  Location: [facility];  Service: General;  Laterality: Left;   COLONOSCOPY      reports that she has never smoked. She has never used smokeless tobacco. She reports that she does not drink alcohol and does not use drugs. family history includes Breast cancer in her paternal grandmother; Melanoma in her mother; Thyroid cancer in her father and maternal grandmother. No Known Allergies    Outpatient Encounter Medications as of 03/02/2023  Medication Sig   clonazePAM (KLONOPIN) 1  MG tablet Take 1-2 mg by mouth 3 (three) times daily as needed for anxiety.   desvenlafaxine (PRISTIQ) 100 MG 24 hr tablet Take 100 mg by mouth in the morning.   LINZESS 145 MCG CAPS capsule Take 145 mcg by mouth every morning.   ondansetron (ZOFRAN-ODT) 4 MG disintegrating tablet Take 4 mg by mouth every 8 (eight) hours as needed.   traZODone (DESYREL) 100 MG tablet Take 400 mg by mouth at bedtime.   VYVANSE 70 MG capsule Take 70 mg by mouth in the morning.   No facility-administered encounter medications on file as of 03/02/2023.     REVIEW OF SYSTEMS  : All other systems reviewed and negative except where noted in the History of Present Illness.   PHYSICAL EXAM: BP 112/76   Pulse 94   Ht 5\' 5"  (1.651 m)   Wt 178 lb (80.7 kg)   BMI 29.62 kg/m  General: Well developed white female in no acute distress Head: Normocephalic and atraumatic Eyes:  Sclerae anicteric, conjunctiva pink. Ears: Normal auditory acuity Lungs: Clear throughout to auscultation; no W/R/R. Heart: Regular rate and rhythm; no M/R/G. Abdomen: Soft, non-distended.  Scar noted from recent surgery.  BS present.  Diffuse TTP. Musculoskeletal: Symmetrical with no gross deformities  Skin: No lesions on visible extremities Extremities: No edema  Neurological: Alert oriented x 4, grossly non-focal Psychological:  Alert and cooperative. Normal mood and affect  ASSESSMENT AND PLAN: *Chronic constipation: Having small pellet-like stools, but Linzess 145  mcg causes explosive diarrhea.  Will have her do a MiraLAX bowel purge to get things cleaned out.  Then we will try the Linzess 72 mcg daily.  Samples were given.  Prescription sent to her pharmacy.  She can update [place] on her progress in about 10 days.  CC:  Nicholas Lose, MD

## 2023-03-08 NOTE — Progress Notes (Signed)
Agree with the assessment and plan as outlined by [name], PA-C.  [name] Kidney, MD  [facility]  

## 2023-05-06 DIAGNOSIS — Z0000 Encounter for general adult medical examination without abnormal findings: Secondary | ICD-10-CM | POA: Diagnosis not present

## 2023-05-06 DIAGNOSIS — Z8719 Personal history of other diseases of the digestive system: Secondary | ICD-10-CM | POA: Diagnosis not present

## 2023-05-06 DIAGNOSIS — F332 Major depressive disorder, recurrent severe without psychotic features: Secondary | ICD-10-CM | POA: Diagnosis not present

## 2023-05-06 DIAGNOSIS — E7800 Pure hypercholesterolemia, unspecified: Secondary | ICD-10-CM | POA: Diagnosis not present

## 2023-05-06 DIAGNOSIS — K5909 Other constipation: Secondary | ICD-10-CM | POA: Diagnosis not present

## 2023-05-06 DIAGNOSIS — R5382 Chronic fatigue, unspecified: Secondary | ICD-10-CM | POA: Diagnosis not present

## 2023-05-06 DIAGNOSIS — G4733 Obstructive sleep apnea (adult) (pediatric): Secondary | ICD-10-CM | POA: Diagnosis not present

## 2023-06-16 DIAGNOSIS — R1032 Left lower quadrant pain: Secondary | ICD-10-CM | POA: Diagnosis not present

## 2023-06-21 DIAGNOSIS — K5710 Diverticulosis of small intestine without perforation or abscess without bleeding: Secondary | ICD-10-CM | POA: Diagnosis not present

## 2023-06-21 DIAGNOSIS — K449 Diaphragmatic hernia without obstruction or gangrene: Secondary | ICD-10-CM | POA: Diagnosis not present

## 2023-06-21 DIAGNOSIS — R1032 Left lower quadrant pain: Secondary | ICD-10-CM | POA: Diagnosis not present

## 2023-06-21 DIAGNOSIS — I700 Atherosclerosis of aorta: Secondary | ICD-10-CM | POA: Diagnosis not present

## 2023-06-21 DIAGNOSIS — K5792 Diverticulitis of intestine, part unspecified, without perforation or abscess without bleeding: Secondary | ICD-10-CM | POA: Diagnosis not present

## 2023-06-28 ENCOUNTER — Ambulatory Visit: Payer: Medicare PPO | Attending: Surgery | Admitting: Rehabilitation

## 2023-06-28 DIAGNOSIS — Z483 Aftercare following surgery for neoplasm: Secondary | ICD-10-CM | POA: Insufficient documentation

## 2023-06-28 DIAGNOSIS — C50412 Malignant neoplasm of upper-outer quadrant of left female breast: Secondary | ICD-10-CM | POA: Insufficient documentation

## 2023-06-28 DIAGNOSIS — Z170 Estrogen receptor positive status [ER+]: Secondary | ICD-10-CM | POA: Insufficient documentation

## 2023-06-28 NOTE — Therapy (Signed)
OUTPATIENT PHYSICAL THERAPY SOZO SCREENING NOTE   Patient Name: Lisa Price MRN: [id number] DOB:Jul 02, 1956, 67 y.o., female Today's Date: 06/28/2023  PCP: Serena Croissant, MD REFERRING PROVIDER: Abigail Miyamoto, MD   PT End of Session - 06/28/23 1512     Visit Number 1   screen only   PT Start Time 1510    PT Stop Time 1513    PT Time Calculation (min) 3 min    Activity Tolerance Patient tolerated treatment well    Behavior During Therapy [facility] for tasks assessed/performed             Past Medical History:  Diagnosis Date   Acid reflux    Anxiety    Cancer (HCC)    left breast CA   Depression    Family history of breast cancer 12/25/2020   Family history of thyroid cancer 12/25/2020   GERD (gastroesophageal reflux disease)    Hiatal hernia    History of radiation therapy 02/24/21-03/25/21   Left breast- Dr. Antony Blackbird   Personal history of radiation therapy    Sleep apnea    Past Surgical History:  Procedure Laterality Date   ABDOMINAL HYSTERECTOMY     BREAST BIOPSY Left 12/19/2020   BREAST LUMPECTOMY Left 01/2021   BREAST LUMPECTOMY WITH RADIOACTIVE SEED AND SENTINEL LYMPH NODE BIOPSY Left 01/10/2021   Procedure: LEFT BREAST LUMPECTOMY WITH RADIOACTIVE SEED AND SENTINEL LYMPH NODE BIOPSY;  Surgeon: Abigail Miyamoto, MD;  Location: [facility];  Service: General;  Laterality: Left;   COLONOSCOPY     Patient Active Problem List   Diagnosis Date Noted   Chronic constipation 03/02/2023   Acute diverticulitis 10/28/2022   Anxiety 10/28/2022   Colonic thickening 07/22/2022   History of breast cancer 07/22/2022   Diverticulitis of intestine with abscess 07/21/2022   Genetic testing 01/03/2021   Family history of breast cancer 12/25/2020   Family history of thyroid cancer 12/25/2020   Malignant neoplasm of upper-outer quadrant of left breast in female, estrogen receptor positive (HCC) 12/24/2020    REFERRING DIAG: left breast cancer at risk for  lymphedema  THERAPY DIAG: Aftercare following surgery for neoplasm  Malignant neoplasm of upper-outer quadrant of left breast in female, estrogen receptor positive (HCC)  PERTINENT HISTORY: Patient was diagnosed on 11/20/2021 with left invasive ductal carcinoma breast cancer.Patient underwent left lumpectomy and sentinel node biopsy (2 negative nodes) on 01/10/2021. It is ER/PR positive and HER2 is pending. Her Ki67 is 3%. She has chronic stomach pain which interferes with daily activities and is also limited by depression and anxiety  PRECAUTIONS: left UE Lymphedema risk, None  SUBJECTIVE: Pt returns for her 3 month L-Dex screen.   PAIN:  Are you having pain? No  SOZO SCREENING: Patient was assessed today using the SOZO machine to determine the lymphedema index score. This was compared to her baseline score. It was determined that she is within the recommended range when compared to her baseline and no further action is needed at this time. She will continue SOZO screenings. These are done every 3 months for 2 years post operatively followed by every 6 months for 2 years, and then annually.   L-DEX FLOWSHEETS - 06/28/23 1500       L-DEX LYMPHEDEMA SCREENING   Measurement Type Unilateral    L-DEX MEASUREMENT EXTREMITY Upper Extremity    POSITION  Standing    DOMINANT SIDE Right    At Risk Side Left    BASELINE SCORE (UNILATERAL) -2.6  L-DEX SCORE (UNILATERAL) -1.6    VALUE CHANGE (UNILAT) 1              Everlyn Farabaugh, Julieanne Manson, PT 06/28/2023, 3:12 PM

## 2023-07-30 DIAGNOSIS — R110 Nausea: Secondary | ICD-10-CM | POA: Diagnosis not present

## 2023-07-30 DIAGNOSIS — R1084 Generalized abdominal pain: Secondary | ICD-10-CM | POA: Diagnosis not present

## 2023-07-30 DIAGNOSIS — R143 Flatulence: Secondary | ICD-10-CM | POA: Diagnosis not present

## 2023-07-30 DIAGNOSIS — K582 Mixed irritable bowel syndrome: Secondary | ICD-10-CM | POA: Diagnosis not present

## 2023-08-23 DIAGNOSIS — R143 Flatulence: Secondary | ICD-10-CM | POA: Diagnosis not present

## 2023-08-27 DIAGNOSIS — R140 Abdominal distension (gaseous): Secondary | ICD-10-CM | POA: Diagnosis not present

## 2023-08-27 DIAGNOSIS — K582 Mixed irritable bowel syndrome: Secondary | ICD-10-CM | POA: Diagnosis not present

## 2023-08-27 DIAGNOSIS — R110 Nausea: Secondary | ICD-10-CM | POA: Diagnosis not present

## 2023-08-27 DIAGNOSIS — K5900 Constipation, unspecified: Secondary | ICD-10-CM | POA: Diagnosis not present

## 2023-08-27 DIAGNOSIS — R143 Flatulence: Secondary | ICD-10-CM | POA: Diagnosis not present

## 2023-08-27 DIAGNOSIS — R1084 Generalized abdominal pain: Secondary | ICD-10-CM | POA: Diagnosis not present

## 2023-12-20 ENCOUNTER — Ambulatory Visit: Payer: Medicare PPO | Attending: Surgery

## 2023-12-20 VITALS — Wt 193.0 lb

## 2023-12-20 DIAGNOSIS — Z483 Aftercare following surgery for neoplasm: Secondary | ICD-10-CM | POA: Insufficient documentation

## 2023-12-20 NOTE — Therapy (Signed)
 OUTPATIENT PHYSICAL THERAPY SOZO SCREENING NOTE   Patient Name: Lisa Price MRN: [id number] DOB:June 15, 1956, 68 y.o., female Today's Date: 12/20/2023  PCP: Odean Potts, MD REFERRING PROVIDER: Vernetta Berg, MD   PT End of Session - 12/20/23 1526     Visit Number 1   # unchanged due to screen only   PT Start Time 1524    PT Stop Time 1528    PT Time Calculation (min) 4 min    Activity Tolerance Patient tolerated treatment well    Behavior During Therapy [facility] for tasks assessed/performed             Past Medical History:  Diagnosis Date   Acid reflux    Anxiety    Cancer (HCC)    left breast CA   Depression    Family history of breast cancer 12/25/2020   Family history of thyroid cancer 12/25/2020   GERD (gastroesophageal reflux disease)    Hiatal hernia    History of radiation therapy 02/24/21-03/25/21   Left breast- Dr. Lynwood Nasuti   Personal history of radiation therapy    Sleep apnea    Past Surgical History:  Procedure Laterality Date   ABDOMINAL HYSTERECTOMY     BREAST BIOPSY Left 12/19/2020   BREAST LUMPECTOMY Left 01/2021   BREAST LUMPECTOMY WITH RADIOACTIVE SEED AND SENTINEL LYMPH NODE BIOPSY Left 01/10/2021   Procedure: LEFT BREAST LUMPECTOMY WITH RADIOACTIVE SEED AND SENTINEL LYMPH NODE BIOPSY;  Surgeon: Vernetta Berg, MD;  Location: [facility];  Service: General;  Laterality: Left;   COLONOSCOPY     Patient Active Problem List   Diagnosis Date Noted   Chronic constipation 03/02/2023   Acute diverticulitis 10/28/2022   Anxiety 10/28/2022   Colonic thickening 07/22/2022   History of breast cancer 07/22/2022   Diverticulitis of intestine with abscess 07/21/2022   Genetic testing 01/03/2021   Family history of breast cancer 12/25/2020   Family history of thyroid cancer 12/25/2020   Malignant neoplasm of upper-outer quadrant of left breast in female, estrogen receptor positive (HCC) 12/24/2020    REFERRING DIAG: left breast  cancer at risk for lymphedema  THERAPY DIAG: Aftercare following surgery for neoplasm  PERTINENT HISTORY: Patient was diagnosed on 11/20/2021 with left invasive ductal carcinoma breast cancer.Patient underwent left lumpectomy and sentinel node biopsy (2 negative nodes) on 01/10/2021. It is ER/PR positive and HER2 is pending. Her Ki67 is 3%. She has chronic stomach pain which interferes with daily activities and is also limited by depression and anxiety  PRECAUTIONS: left UE Lymphedema risk, None  SUBJECTIVE: Pt returns for her last 3 month L-Dex screen.   PAIN:  Are you having pain? No  SOZO SCREENING: Patient was assessed today using the SOZO machine to determine the lymphedema index score. This was compared to her baseline score. It was determined that she is within the recommended range when compared to her baseline and no further action is needed at this time. She will continue SOZO screenings. These are done every 3 months for 2 years post operatively followed by every 6 months for 2 years, and then annually.   L-DEX FLOWSHEETS - 12/20/23 1500       L-DEX LYMPHEDEMA SCREENING   Measurement Type Unilateral    L-DEX MEASUREMENT EXTREMITY Upper Extremity    POSITION  Standing    DOMINANT SIDE Right    At Risk Side Left    BASELINE SCORE (UNILATERAL) -2.6    L-DEX SCORE (UNILATERAL) 0.4    VALUE CHANGE (  UNILAT) 3            P: Begin 6 month L-Dex screens next.   Aden Berwyn Caldron, PTA 12/20/2023, 3:28 PM

## 2023-12-22 ENCOUNTER — Inpatient Hospital Stay: Payer: Medicare PPO | Attending: Hematology and Oncology | Admitting: Hematology and Oncology

## 2023-12-22 VITALS — BP 141/80 | HR 95 | Temp 97.5°F | Resp 18 | Ht 65.0 in | Wt 192.8 lb

## 2023-12-22 DIAGNOSIS — F32A Depression, unspecified: Secondary | ICD-10-CM | POA: Diagnosis not present

## 2023-12-22 DIAGNOSIS — Z170 Estrogen receptor positive status [ER+]: Secondary | ICD-10-CM | POA: Diagnosis not present

## 2023-12-22 DIAGNOSIS — Z1721 Progesterone receptor positive status: Secondary | ICD-10-CM | POA: Diagnosis not present

## 2023-12-22 DIAGNOSIS — Z923 Personal history of irradiation: Secondary | ICD-10-CM | POA: Insufficient documentation

## 2023-12-22 DIAGNOSIS — Z79899 Other long term (current) drug therapy: Secondary | ICD-10-CM | POA: Insufficient documentation

## 2023-12-22 DIAGNOSIS — F419 Anxiety disorder, unspecified: Secondary | ICD-10-CM | POA: Insufficient documentation

## 2023-12-22 DIAGNOSIS — Z1732 Human epidermal growth factor receptor 2 negative status: Secondary | ICD-10-CM | POA: Diagnosis not present

## 2023-12-22 DIAGNOSIS — Z79811 Long term (current) use of aromatase inhibitors: Secondary | ICD-10-CM | POA: Diagnosis not present

## 2023-12-22 DIAGNOSIS — R5383 Other fatigue: Secondary | ICD-10-CM | POA: Insufficient documentation

## 2023-12-22 DIAGNOSIS — C50412 Malignant neoplasm of upper-outer quadrant of left female breast: Secondary | ICD-10-CM | POA: Diagnosis not present

## 2023-12-22 NOTE — Assessment & Plan Note (Signed)
 01/10/21: Left lumpectomy Lisa Price): invasive and in situ ductal carcinoma, 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  HER-2 equivocal by IHC (2+), FISH neg ER/PR+ >95%, Ki67 3%.   Plan: 1. Oncotype Dx 7 (3% ROR) 2. Adj XRT 02/26/21- 03/25/21 3. Adj Anti estrogen therapy   Anastrozole  toxicities: Patient has not started anastrozole  yet. I recommended that she try taking it 3 times a week and see how she tolerates it.  She intends to do that.  If she tolerates it well she will continue with the plan.   Breast cancer surveillance: 1.  Breast exam 12/22/2023: Benign 2. mammogram:  02/15/2023: Benign breast density category B Bone density 06/23/2021: T score -0.7: Normal   Hospitalization: 10/28/2022-10/31/2022: Complicated diverticulitis  Operated at [facility] and had a laparoscopic surgery for adhesions.   Severe fatigue: Previous blood work 12/21/2022: Hemoglobin 13, folate greater than 40, B12 654, iron saturation 20%, ferritin 29 (labs were all normal)   Return to clinic in 1 year for follow-up

## 2023-12-22 NOTE — Progress Notes (Signed)
 Patient Care Team: Cameron Cea, MD as PCP - General (Hematology and Oncology) Oza Blumenthal, MD as Consulting Physician (General Surgery) Cameron Cea, MD as Consulting Physician (Hematology and Oncology) Retta Caster, MD as Consulting Physician (Radiation Oncology)  DIAGNOSIS:  Encounter Diagnosis  Name Primary?   Malignant neoplasm of upper-outer quadrant of left breast in female, estrogen receptor positive (HCC) Yes    SUMMARY OF ONCOLOGIC HISTORY: Oncology History  Malignant neoplasm of upper-outer quadrant of left breast in female, estrogen receptor positive (HCC)  12/24/2020 Initial Diagnosis   Screening mammogram showed a 0.8cm mass at the 2 o'clock position in the left breast and no left axillary adenopathy. Biopsy showed invasive and in situ carcinoma, grade 2, HER-2 equivocal by IHC (2+), ER/PR+ >95%, Ki67 3%.   12/25/2020 Cancer Staging   Staging form: Breast, AJCC 8th Edition - Clinical stage from 12/25/2020: Stage IA (cT1b, cN0, cM0, G2, ER+, PR+, HER2: Equivocal) - Signed by Cameron Cea, MD on 12/25/2020   01/01/2021 Genetic Testing   Negative genetic testing: no pathogenic variants detected in [facility] BRCAPlus Panel.  The report date is January 01, 2021.    The BRCAplus panel offered by [employer] and includes sequencing and deletion/duplication analysis for the following 8 genes: ATM, BRCA1, BRCA2, CDH1, CHEK2, PALB2, PTEN, and TP53.    Negative genetic testing: no pathogenic variants detected in [facility] CancerNext-Expanded +RNAinsight.  The report date is January 05, 2021.    The CancerNext-Expanded gene panel offered by [facility] and includes sequencing, rearrangement, and RNA analysis for the following 77 genes: AIP, ALK, APC, ATM, AXIN2, BAP1, BARD1, BLM, BMPR1A, BRCA1, BRCA2, BRIP1, CDC73, CDH1, CDK4, CDKN1B, CDKN2A, CHEK2, CTNNA1, DICER1, FANCC, FH, FLCN, GALNT12, KIF1B, LZTR1, MAX, MEN1, MET, MLH1, MSH2, MSH3, MSH6, MUTYH, NBN, NF1, NF2, NTHL1, PALB2,  PHOX2B, PMS2, POT1, PRKAR1A, PTCH1, PTEN, RAD51C, RAD51D, RB1, RECQL, RET, SDHA, SDHAF2, SDHB, SDHC, SDHD, SMAD4, SMARCA4, SMARCB1, SMARCE1, STK11, SUFU, TMEM127, TP53, TSC1, TSC2, VHL and XRCC2 (sequencing and deletion/duplication); EGFR, EGLN1, HOXB13, KIT, MITF, PDGFRA, POLD1, and POLE (sequencing only); EPCAM and GREM1 (deletion/duplication only).    01/10/2021 Surgery   Left lumpectomy Lucienne Ryder): invasive and in situ ductal carcinoma, 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   01/10/2021 Cancer Staging   Staging form: Breast, AJCC 8th Edition - Pathologic stage from 01/10/2021: Stage IA (pT1c, pN0, cM0, G2, ER+, PR+, HER2-) - Signed by Percival Brace, NP on 06/10/2021 Stage prefix: Initial diagnosis Histologic grading system: 3 grade system   01/10/2021 Oncotype testing   7/3%   02/24/2021 - 03/25/2021 Radiation Therapy   Adjuvant radiation Site Technique Total Dose (Gy) Dose per Fx (Gy) Completed Fx Beam Energies  Breast, Left: Breast_Lt 3D 40.05/40.05 2.67 15/15 6X, 10X  Breast, Left: Breast_Lt_Bst 3D 12/12 2 6/6 6X, 10X     04/06/2021 -  Anti-estrogen oral therapy   Anastrozole  daily     CHIEF COMPLIANT: Surveillance of breast cancer  HISTORY OF PRESENT ILLNESS:   History of Present Illness   The patient, with a history of anxiety, depression, and chronic fatigue, presents for a routine follow-up. She reports ongoing fatigue, which has been a persistent issue over the past year. Despite extensive workup, including hemoglobin, iron, and B12 levels, no clear etiology for the fatigue has been identified. The patient also expresses concerns about nausea, which may be contributing to her reluctance to try antiestrogen therapy.    In terms of her physical health, the patient reports occasional sharp pains  in her breasts, but does not believe it to be a significant issue. She is due for a mammogram in March. The patient also mentions a lack of regular  exercise, acknowledging the importance of physical activity, especially in the context of her health issues.         ALLERGIES:  has no known allergies.  MEDICATIONS:  Current Outpatient Medications  Medication Sig Dispense Refill   clonazePAM  (KLONOPIN ) 1 MG tablet Take 1-2 mg by mouth 3 (three) times daily as needed for anxiety.     desvenlafaxine  (PRISTIQ ) 100 MG 24 hr tablet Take 100 mg by mouth in the morning.     linaclotide  (LINZESS ) 72 MCG capsule Take 1 capsule (72 mcg total) by mouth daily before breakfast. 30 capsule 3   ondansetron  (ZOFRAN -ODT) 4 MG disintegrating tablet Take 4 mg by mouth every 8 (eight) hours as needed.     traZODone  (DESYREL ) 100 MG tablet Take 400 mg by mouth at bedtime.     VYVANSE  70 MG capsule Take 70 mg by mouth in the morning.     No current facility-administered medications for this visit.    PHYSICAL EXAMINATION: ECOG PERFORMANCE STATUS: 1 - Symptomatic but completely ambulatory  Vitals:   12/22/23 1543  BP: (!) 141/80  Pulse: 95  Resp: 18  Temp: (!) 97.5 F (36.4 C)  SpO2: 97%   Filed Weights   12/22/23 1543  Weight: 192 lb 12.8 oz (87.5 kg)    Physical Exam   BREAST: No palpable lumps or nodules in bilateral breasts or axilla     (exam performed in the presence of a chaperone)  LABORATORY DATA:  I have reviewed the data as listed    Latest Ref Rng & Units 10/31/2022    5:20 AM 10/30/2022    4:15 AM 10/29/2022    4:37 AM  CMP  Glucose 70 - 99 mg/dL 91  98  97   BUN 8 - 23 mg/dL 8  10  13    Creatinine 0.44 - 1.00 mg/dL 5.28  4.13  2.44   Sodium 135 - 145 mmol/L 141  140  138   Potassium 3.5 - 5.1 mmol/L 3.6  3.8  3.8   Chloride 98 - 111 mmol/L 110  108  105   CO2 22 - 32 mmol/L 24  27  26    Calcium 8.9 - 10.3 mg/dL 8.8  8.7  8.8   Total Protein 6.5 - 8.1 g/dL 6.1  6.1  6.0   Total Bilirubin 0.3 - 1.2 mg/dL 0.4  0.5  0.6   Alkaline Phos 38 - 126 U/L 62  64  68   AST 15 - 41 U/L 12  12  12    ALT 0 - 44 U/L 9  8  10       Lab Results  Component Value Date   WBC 6.4 12/21/2022   HGB 13.0 12/21/2022   HCT 40.5 12/21/2022   MCV 87.7 12/21/2022   PLT 310 12/21/2022   NEUTROABS 3.4 12/21/2022    ASSESSMENT & PLAN:  Malignant neoplasm of upper-outer quadrant of left breast in female, estrogen receptor positive (HCC) 01/10/21: Left lumpectomy Lucienne Ryder): invasive and in situ ductal carcinoma, 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  HER-2 equivocal by IHC (2+), FISH neg ER/PR+ >95%, Ki67 3%.   Plan: 1. Oncotype Dx 7 (3% ROR) 2. Adj XRT 02/26/21- 03/25/21 3. Adj Anti estrogen therapy (patient might try half a tablet today 12/22/2023)  Breast cancer surveillance: 1.  Breast exam 12/22/2023: Benign 2. mammogram:  02/15/2023: Benign breast density category B Bone density 06/23/2021: T score -0.7: Normal   Hospitalization: 10/28/2022-10/31/2022: Complicated diverticulitis  Operated at [facility] and had a laparoscopic surgery for adhesions.   Severe fatigue: Previous blood work 12/21/2022: Hemoglobin 13, folate greater than 40, B12 654, iron saturation 20%, ferritin 29 (labs were all normal).  Possibly related to underlying anxiety and depression.   Return to clinic in 1 year for follow-up     No orders of the defined types were placed in this encounter.  The patient has a good understanding of the overall plan. she agrees with it. she will call with any problems that may develop before the next visit here. Total time spent: 30 mins including face to face time and time spent for planning, charting and co-ordination of care   Viinay K Amara Justen, MD 12/22/23

## 2024-01-03 DIAGNOSIS — Z961 Presence of intraocular lens: Secondary | ICD-10-CM | POA: Diagnosis not present

## 2024-01-03 DIAGNOSIS — H26492 Other secondary cataract, left eye: Secondary | ICD-10-CM | POA: Diagnosis not present

## 2024-01-03 DIAGNOSIS — H43813 Vitreous degeneration, bilateral: Secondary | ICD-10-CM | POA: Diagnosis not present

## 2024-01-03 DIAGNOSIS — H31092 Other chorioretinal scars, left eye: Secondary | ICD-10-CM | POA: Diagnosis not present

## 2024-02-09 ENCOUNTER — Other Ambulatory Visit: Payer: Self-pay | Admitting: Internal Medicine

## 2024-02-09 DIAGNOSIS — Z09 Encounter for follow-up examination after completed treatment for conditions other than malignant neoplasm: Secondary | ICD-10-CM

## 2024-03-13 ENCOUNTER — Other Ambulatory Visit: Payer: Self-pay | Admitting: Internal Medicine

## 2024-03-13 ENCOUNTER — Ambulatory Visit
Admission: RE | Admit: 2024-03-13 | Discharge: 2024-03-13 | Disposition: A | Discharge status: Home / Self Care | Source: Ambulatory Visit | Attending: Internal Medicine | Admitting: Internal Medicine

## 2024-03-13 DIAGNOSIS — Z08 Encounter for follow-up examination after completed treatment for malignant neoplasm: Secondary | ICD-10-CM | POA: Diagnosis not present

## 2024-03-13 DIAGNOSIS — Z853 Personal history of malignant neoplasm of breast: Secondary | ICD-10-CM | POA: Diagnosis not present

## 2024-03-13 DIAGNOSIS — Z09 Encounter for follow-up examination after completed treatment for conditions other than malignant neoplasm: Secondary | ICD-10-CM

## 2024-05-10 DIAGNOSIS — K5909 Other constipation: Secondary | ICD-10-CM | POA: Diagnosis not present

## 2024-05-10 DIAGNOSIS — G4733 Obstructive sleep apnea (adult) (pediatric): Secondary | ICD-10-CM | POA: Diagnosis not present

## 2024-05-10 DIAGNOSIS — Z0000 Encounter for general adult medical examination without abnormal findings: Secondary | ICD-10-CM | POA: Diagnosis not present

## 2024-05-10 DIAGNOSIS — M545 Low back pain, unspecified: Secondary | ICD-10-CM | POA: Diagnosis not present

## 2024-05-10 DIAGNOSIS — M7062 Trochanteric bursitis, left hip: Secondary | ICD-10-CM | POA: Diagnosis not present

## 2024-05-10 DIAGNOSIS — E7800 Pure hypercholesterolemia, unspecified: Secondary | ICD-10-CM | POA: Diagnosis not present

## 2024-05-10 DIAGNOSIS — Z8719 Personal history of other diseases of the digestive system: Secondary | ICD-10-CM | POA: Diagnosis not present

## 2024-05-10 DIAGNOSIS — F332 Major depressive disorder, recurrent severe without psychotic features: Secondary | ICD-10-CM | POA: Diagnosis not present

## 2024-05-10 DIAGNOSIS — K219 Gastro-esophageal reflux disease without esophagitis: Secondary | ICD-10-CM | POA: Diagnosis not present

## 2024-05-29 DIAGNOSIS — M545 Low back pain, unspecified: Secondary | ICD-10-CM | POA: Diagnosis not present

## 2024-06-05 DIAGNOSIS — M545 Low back pain, unspecified: Secondary | ICD-10-CM | POA: Diagnosis not present

## 2024-06-13 DIAGNOSIS — M545 Low back pain, unspecified: Secondary | ICD-10-CM | POA: Diagnosis not present

## 2024-06-19 ENCOUNTER — Ambulatory Visit: Payer: Medicare PPO | Attending: Surgery

## 2024-06-19 VITALS — Wt 187.0 lb

## 2024-06-19 DIAGNOSIS — Z483 Aftercare following surgery for neoplasm: Secondary | ICD-10-CM | POA: Insufficient documentation

## 2024-06-19 NOTE — Therapy (Signed)
 OUTPATIENT PHYSICAL THERAPY SOZO SCREENING NOTE   Patient Name: Lisa Price MRN: [id number] DOB:05-25-1956, 68 y.o., female Today's Date: 06/19/2024  PCP: Odean Potts, MD REFERRING PROVIDER: Vernetta Berg, MD   PT End of Session - 06/19/24 1513     Visit Number 1   # unchanged due to screen only   PT Start Time 1511    PT Stop Time 1515    PT Time Calculation (min) 4 min    Activity Tolerance Patient tolerated treatment well    Behavior During Therapy [facility] for tasks assessed/performed          Past Medical History:  Diagnosis Date   Acid reflux    Anxiety    Cancer (HCC)    left breast CA   Depression    Family history of breast cancer 12/25/2020   Family history of thyroid cancer 12/25/2020   GERD (gastroesophageal reflux disease)    Hiatal hernia    History of radiation therapy 02/24/21-03/25/21   Left breast- Dr. Lynwood Nasuti   Personal history of radiation therapy    Sleep apnea    Past Surgical History:  Procedure Laterality Date   ABDOMINAL HYSTERECTOMY     BREAST BIOPSY Left 12/19/2020   BREAST LUMPECTOMY Left 01/2021   BREAST LUMPECTOMY WITH RADIOACTIVE SEED AND SENTINEL LYMPH NODE BIOPSY Left 01/10/2021   Procedure: LEFT BREAST LUMPECTOMY WITH RADIOACTIVE SEED AND SENTINEL LYMPH NODE BIOPSY;  Surgeon: Vernetta Berg, MD;  Location: [facility];  Service: General;  Laterality: Left;   COLONOSCOPY     Patient Active Problem List   Diagnosis Date Noted   Chronic constipation 03/02/2023   Acute diverticulitis 10/28/2022   Anxiety 10/28/2022   Colonic thickening 07/22/2022   History of breast cancer 07/22/2022   Diverticulitis of intestine with abscess 07/21/2022   Genetic testing 01/03/2021   Family history of breast cancer 12/25/2020   Family history of thyroid cancer 12/25/2020   Malignant neoplasm of upper-outer quadrant of left breast in female, estrogen receptor positive (HCC) 12/24/2020    REFERRING DIAG: left breast  cancer at risk for lymphedema  THERAPY DIAG: Aftercare following surgery for neoplasm  PERTINENT HISTORY: Patient was diagnosed on 11/20/2021 with left invasive ductal carcinoma breast cancer.Patient underwent left lumpectomy and sentinel node biopsy (2 negative nodes) on 01/10/2021. It is ER/PR positive and HER2 is pending. Her Ki67 is 3%. She has chronic stomach pain which interferes with daily activities and is also limited by depression and anxiety  PRECAUTIONS: left UE Lymphedema risk, None  SUBJECTIVE: Pt returns for her first 6 month L-Dex screen.   PAIN:  Are you having pain? No  SOZO SCREENING: Patient was assessed today using the SOZO machine to determine the lymphedema index score. This was compared to her baseline score. It was determined that she is within the recommended range when compared to her baseline and no further action is needed at this time. She will continue SOZO screenings. These are done every 3 months for 2 years post operatively followed by every 6 months for 2 years, and then annually.   L-DEX FLOWSHEETS - 06/19/24 1500       L-DEX LYMPHEDEMA SCREENING   Measurement Type Unilateral    L-DEX MEASUREMENT EXTREMITY Upper Extremity    POSITION  Standing    DOMINANT SIDE Right    At Risk Side Left    BASELINE SCORE (UNILATERAL) -2.6    L-DEX SCORE (UNILATERAL) 1.1    VALUE CHANGE (UNILAT) 3.7  P: Cont 6 month L-Dex screens.   Aden Berwyn Caldron, PTA 06/19/2024, 3:15 PM

## 2024-06-20 DIAGNOSIS — M545 Low back pain, unspecified: Secondary | ICD-10-CM | POA: Diagnosis not present

## 2024-06-27 DIAGNOSIS — M545 Low back pain, unspecified: Secondary | ICD-10-CM | POA: Diagnosis not present

## 2024-07-04 DIAGNOSIS — M545 Low back pain, unspecified: Secondary | ICD-10-CM | POA: Diagnosis not present

## 2024-07-09 DIAGNOSIS — S0011XA Contusion of right eyelid and periocular area, initial encounter: Secondary | ICD-10-CM | POA: Diagnosis not present

## 2024-07-26 DIAGNOSIS — R1084 Generalized abdominal pain: Secondary | ICD-10-CM | POA: Diagnosis not present

## 2024-07-26 DIAGNOSIS — R143 Flatulence: Secondary | ICD-10-CM | POA: Diagnosis not present

## 2024-07-26 DIAGNOSIS — K581 Irritable bowel syndrome with constipation: Secondary | ICD-10-CM | POA: Diagnosis not present

## 2024-07-26 DIAGNOSIS — R1032 Left lower quadrant pain: Secondary | ICD-10-CM | POA: Diagnosis not present

## 2024-07-26 DIAGNOSIS — F419 Anxiety disorder, unspecified: Secondary | ICD-10-CM | POA: Diagnosis not present

## 2024-09-05 DIAGNOSIS — F332 Major depressive disorder, recurrent severe without psychotic features: Secondary | ICD-10-CM | POA: Diagnosis not present

## 2024-09-05 DIAGNOSIS — F411 Generalized anxiety disorder: Secondary | ICD-10-CM | POA: Diagnosis not present

## 2024-09-05 DIAGNOSIS — F909 Attention-deficit hyperactivity disorder, unspecified type: Secondary | ICD-10-CM | POA: Diagnosis not present

## 2024-09-05 DIAGNOSIS — Z79899 Other long term (current) drug therapy: Secondary | ICD-10-CM | POA: Diagnosis not present

## 2024-10-12 DIAGNOSIS — F411 Generalized anxiety disorder: Secondary | ICD-10-CM | POA: Diagnosis not present

## 2024-10-12 DIAGNOSIS — F331 Major depressive disorder, recurrent, moderate: Secondary | ICD-10-CM | POA: Diagnosis not present

## 2024-10-12 DIAGNOSIS — F909 Attention-deficit hyperactivity disorder, unspecified type: Secondary | ICD-10-CM | POA: Diagnosis not present

## 2024-10-12 DIAGNOSIS — G4700 Insomnia, unspecified: Secondary | ICD-10-CM | POA: Diagnosis not present

## 2024-12-18 ENCOUNTER — Ambulatory Visit

## 2024-12-21 ENCOUNTER — Inpatient Hospital Stay: Payer: Medicare PPO | Admitting: Hematology and Oncology
